# Patient Record
Sex: Male | Born: 1940 | Race: White | Hispanic: No | Marital: Married | Smoking: Former smoker
Health system: Southern US, Community
[De-identification: ages and names within clinical notes are randomized; demographics above are authoritative.]

## PROBLEM LIST (undated history)

## (undated) DIAGNOSIS — N529 Male erectile dysfunction, unspecified: Secondary | ICD-10-CM

## (undated) DIAGNOSIS — M1990 Unspecified osteoarthritis, unspecified site: Secondary | ICD-10-CM

## (undated) DIAGNOSIS — I447 Left bundle-branch block, unspecified: Secondary | ICD-10-CM

## (undated) DIAGNOSIS — Z955 Presence of coronary angioplasty implant and graft: Secondary | ICD-10-CM

## (undated) DIAGNOSIS — I2510 Atherosclerotic heart disease of native coronary artery without angina pectoris: Secondary | ICD-10-CM

## (undated) DIAGNOSIS — Z87898 Personal history of other specified conditions: Secondary | ICD-10-CM

## (undated) DIAGNOSIS — I252 Old myocardial infarction: Secondary | ICD-10-CM

## (undated) DIAGNOSIS — E7800 Pure hypercholesterolemia, unspecified: Secondary | ICD-10-CM

## (undated) DIAGNOSIS — I10 Essential (primary) hypertension: Secondary | ICD-10-CM

## (undated) DIAGNOSIS — C61 Malignant neoplasm of prostate: Secondary | ICD-10-CM

## (undated) HISTORY — PX: HEMORRHOID SURGERY: SHX153

## (undated) HISTORY — PX: CARDIAC CATHETERIZATION: SHX172

## (undated) HISTORY — PX: CARDIOVASCULAR STRESS TEST: SHX262

## (undated) HISTORY — PX: CORONARY ANGIOPLASTY WITH STENT PLACEMENT: SHX49

---

## 1978-10-04 HISTORY — PX: INGUINAL HERNIA REPAIR: SUR1180

## 2001-02-02 DIAGNOSIS — Z87898 Personal history of other specified conditions: Secondary | ICD-10-CM

## 2001-02-02 HISTORY — DX: Personal history of other specified conditions: Z87.898

## 2003-01-03 DIAGNOSIS — I252 Old myocardial infarction: Secondary | ICD-10-CM

## 2003-01-03 HISTORY — DX: Old myocardial infarction: I25.2

## 2003-01-17 ENCOUNTER — Other Ambulatory Visit: Payer: Self-pay

## 2003-01-18 ENCOUNTER — Other Ambulatory Visit: Payer: Self-pay

## 2006-04-01 ENCOUNTER — Ambulatory Visit (HOSPITAL_COMMUNITY): Admission: RE | Admit: 2006-04-01 | Discharge: 2006-04-01 | Payer: Self-pay | Admitting: Cardiovascular Disease

## 2007-08-25 ENCOUNTER — Ambulatory Visit: Payer: Self-pay | Admitting: Unknown Physician Specialty

## 2010-05-30 HISTORY — PX: TRANSTHORACIC ECHOCARDIOGRAM: SHX275

## 2012-02-24 DIAGNOSIS — C61 Malignant neoplasm of prostate: Secondary | ICD-10-CM

## 2012-02-24 HISTORY — DX: Malignant neoplasm of prostate: C61

## 2012-02-29 HISTORY — PX: PROSTATE BIOPSY: SHX241

## 2012-04-01 ENCOUNTER — Encounter: Payer: Self-pay | Admitting: Radiation Oncology

## 2012-04-05 ENCOUNTER — Encounter: Payer: Self-pay | Admitting: Radiation Oncology

## 2012-04-05 NOTE — Progress Notes (Signed)
New Consult Prostate Cancer BX:02/29/12=Adenocarcinoma,gleason 3+3=6,& 3+4=7,Volume=37.5cc,PSA=5.80 Hx :Psa 10 years ago=0.95  Married,2 sons,2 daughter,  Alert,oriented x3, no pain, no dysuria, good stream most of the time, once in a while not emptying bladder completely, bowels regular  Interested in Seed Implantation   Allergies: Codeine Derivatives  No History Radiation Therapy No History of a Pacemaker

## 2012-04-06 ENCOUNTER — Ambulatory Visit
Admission: RE | Admit: 2012-04-06 | Discharge: 2012-04-06 | Disposition: A | Discharge status: Home / Self Care | Payer: Medicare PPO | Source: Ambulatory Visit | Attending: Radiation Oncology | Admitting: Radiation Oncology

## 2012-04-06 ENCOUNTER — Encounter: Payer: Self-pay | Admitting: Radiation Oncology

## 2012-04-06 VITALS — BP 161/84 | HR 54 | Temp 98.2°F | Resp 20 | Ht 71.0 in | Wt 205.4 lb

## 2012-04-06 DIAGNOSIS — R300 Dysuria: Secondary | ICD-10-CM | POA: Insufficient documentation

## 2012-04-06 DIAGNOSIS — Z7982 Long term (current) use of aspirin: Secondary | ICD-10-CM | POA: Insufficient documentation

## 2012-04-06 DIAGNOSIS — C61 Malignant neoplasm of prostate: Secondary | ICD-10-CM | POA: Insufficient documentation

## 2012-04-06 DIAGNOSIS — R5381 Other malaise: Secondary | ICD-10-CM | POA: Insufficient documentation

## 2012-04-06 DIAGNOSIS — R3911 Hesitancy of micturition: Secondary | ICD-10-CM | POA: Insufficient documentation

## 2012-04-06 DIAGNOSIS — I252 Old myocardial infarction: Secondary | ICD-10-CM | POA: Insufficient documentation

## 2012-04-06 DIAGNOSIS — R3915 Urgency of urination: Secondary | ICD-10-CM | POA: Insufficient documentation

## 2012-04-06 DIAGNOSIS — E7800 Pure hypercholesterolemia, unspecified: Secondary | ICD-10-CM | POA: Insufficient documentation

## 2012-04-06 DIAGNOSIS — Z9861 Coronary angioplasty status: Secondary | ICD-10-CM | POA: Insufficient documentation

## 2012-04-06 DIAGNOSIS — Z79899 Other long term (current) drug therapy: Secondary | ICD-10-CM | POA: Insufficient documentation

## 2012-04-06 DIAGNOSIS — R5383 Other fatigue: Secondary | ICD-10-CM | POA: Insufficient documentation

## 2012-04-06 DIAGNOSIS — I10 Essential (primary) hypertension: Secondary | ICD-10-CM | POA: Insufficient documentation

## 2012-04-06 HISTORY — DX: Presence of coronary angioplasty implant and graft: Z95.5

## 2012-04-06 HISTORY — DX: Essential (primary) hypertension: I10

## 2012-04-06 HISTORY — DX: Pure hypercholesterolemia, unspecified: E78.00

## 2012-04-06 HISTORY — DX: Malignant neoplasm of prostate: C61

## 2012-04-06 HISTORY — DX: Unspecified osteoarthritis, unspecified site: M19.90

## 2012-04-06 HISTORY — DX: Personal history of other specified conditions: Z87.898

## 2012-04-06 HISTORY — DX: Male erectile dysfunction, unspecified: N52.9

## 2012-04-06 NOTE — Progress Notes (Signed)
Please see the Nurse Progress Note in the MD Initial Consult Encounter for this patient. 

## 2012-04-06 NOTE — Progress Notes (Signed)
  Radiation Oncology         [phone]) [phone] ________________________________  Name: Daniel Hanna MRN: [id number]  Date: 04/06/2012  DOB: 17-Mar-1940  SIMULATION AND TREATMENT PLANNING NOTE PUBIC ARCH STUDY  CC:No primary provider on file.  No ref. 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  COMPLEX SIMULATION:  The patient presented today for evaluation for possible prostate seed implant. He was brought to the radiation planning suite and placed supine on the CT couch. A 3-dimensional image study set was obtained in upload to the planning computer. There, on each axial slice, I contoured the prostate gland. Then, using three-dimensional radiation planning tools I reconstructed the prostate in view of the structures from the transperineal needle pathway to assess for possible pubic arch interference. In doing so, I did not appreciate any pubic arch interference. Also, the patient's prostate volume was estimated based on the drawn structure. The volume was 41 cc.  Given the pubic arch appearance and prostate volume, patient remains a good candidate to proceed with prostate seed implant. Today, he freely provided informed written consent to proceed.    PLAN: The patient will undergo prostate seed implant.   ________________________________  Artist Pais. Kathrynn Running, M.D.

## 2012-04-07 NOTE — Addendum Note (Signed)
Encounter addended by: Oneita Hurt, MD on: 04/07/2012  4:16 PM<BR>     Documentation filed: Notes Section

## 2012-04-07 NOTE — Progress Notes (Signed)
Radiation Oncology         [phone] ________________________________  Initial outpatient Consultation  Name: Daniel Hanna MRN: [id number]  Date: 04/06/2012  DOB: [birth year]  CC:No primary provider on file.  Marcine Matar, MD   REFERRING PHYSICIAN: Marcine Matar, MD  DIAGNOSIS: 72 y.o. gentleman with stage T1c adenocarcinoma of the prostate with a Gleason's score of 3+4 and a PSA of 5.8  HISTORY OF PRESENT ILLNESS::Daniel Hanna is a 72 y.o. gentleman.  He was noted to have an elevated PSA of 5.8 by his primary care physician, Dr. Daphine Deutscher.  Accordingly, he was referred for evaluation in urology by Dr. Retta Diones on 01/20/12,  digital rectal examination was performed at that time revealing 40 gm prostate with no nodules.  The patient proceeded to transrectal ultrasound with 12 biopsies of the prostate on 1/27/114.  The prostate volume measured 37.5 cc.  Out of 12 core biopsies,8 were positive.  The maximum Gleason score was 3+4, and this was seen in one core from the Right lateral mid and one core from the left lateral mid.  The other cores contained 3+3.  The patient reviewed the biopsy results with his urologist and he has kindly been referred today for discussion of potential radiation treatment options.  PREVIOUS RADIATION THERAPY: No  PAST MEDICAL HISTORY:  has a past medical history of History of elevated PSA (2003); Prostate cancer (02/24/12); Hypertension; Hypercholesterolemia; History of coronary artery stent placement; H/O hernia repair; H/O hemorrhoidectomy; ED (erectile dysfunction); Myocardial infarction (01/2003); Allergy; Arthritis; and Full dentures.    PAST SURGICAL HISTORY: Past Surgical History  Procedure Laterality Date  . Prostate biopsy  02/29/12    ADENOCARCINOMA    FAMILY HISTORY: family history includes Cancer (age of onset: 54) in his sister.  SOCIAL HISTORY:  reports that he has quit smoking. He has never used smokeless tobacco. He reports that he does not  drink alcohol or use illicit drugs.  ALLERGIES: Codeine  MEDICATIONS:  Current Outpatient Prescriptions  Medication Sig Dispense Refill  . aspirin 81 MG tablet Take 81 mg by mouth daily.      . carvedilol (COREG) 6.25 MG tablet Take 6.25 mg by mouth once a week.       . clopidogrel (PLAVIX) 75 MG tablet Take 75 mg by mouth daily.      . Coenzyme Q10 (CO Q 10) 100 MG CAPS Take 1 capsule by mouth as needed.      Marland Kitchen HYDROXYZINE HCL PO Take 25 mg by mouth as needed.       Marland Kitchen lisinopril (PRINIVIL,ZESTRIL) 10 MG tablet Take 10 mg by mouth daily.      . rosuvastatin (CRESTOR) 5 MG tablet Take 5 mg by mouth daily. Takes a 5mg  tab and takes this over 4 days       No current facility-administered medications for this encounter.    REVIEW OF SYSTEMS:  A 15 point review of systems is documented in the electronic medical record. This was obtained by the nursing staff. However, I reviewed this with the patient to discuss relevant findings and make appropriate changes.  A comprehensive review of systems was negative..  The patient completed an IPSS and IIEF questionnaire.  His IPSS score was 6 indicating mild urinary outflow obstructive symptoms.  He indicated that his erectile function is not an issue.   PHYSICAL EXAM: This patient is in no acute distress.  He is alert and oriented.   height is 5\' 11"  (1.803 m) and weight is 205  lb 6.4 oz (93.169 kg). His oral temperature is 98.2 F (36.8 C). His blood pressure is 161/84 and his pulse is 54. His respiration is 20.  He exhibits no respiratory distress or labored breathing.  He appears neurologically intact.  His mood is pleasant.  His affect is appropriate.  Please note the digital rectal exam findings described above.  LABORATORY DATA:  No results found for this basename: WBC, HGB, HCT, MCV, PLT   No results found for this basename: NA, K, CL, CO2   No results found for this basename: ALT, AST, GGT, ALKPHOS, BILITOT     RADIOGRAPHY: No results  found.    IMPRESSION: This gentleman is a 72 y.o. gentleman with stage T1c adenocarcinoma of the prostate with a Gleason's score of 3+4 and a PSA of 5.8.  His T-Stage, Gleason's Score, and PSA put him into the intermediate risk group. The small volume of Gleason's 7 with a primary grade of 3 places him into a select subset of patients eligible for brachytherapy as monotherapy.    PLAN:Today I reviewed the findings and workup thus far.  We discussed the natural history of prostate cancer.  We reviewed the the implications of T-stage, Gleason's Score, and PSA on decision-making and outcomes in prostate cancer.  We discussed radiation treatment in the management of prostate cancer with regard to the logistics and delivery of external beam radiation treatment as well as the logistics and delivery of prostate brachytherapy.  We compared and contrasted each of these approaches and also compared these against prostatectomy.  The patient expressed interest in prostate brachytherapy.  I filled out a patient counseling form for him with relevant treatment diagrams and we retained a copy for our records.   The patient would like to proceed with prostate brachytherapy.  I will share my findings with Dr. Retta Diones and move forward with scheduling the procedure in the near future.     I enjoyed meeting with him today, and will look forward to participating in the care of this very nice gentleman.  I spent 60 minutes face to face with the patient and more than 50% of that time was spent in counseling and/or coordination of care.   ------------------------------------------------  Artist Pais. Kathrynn Running, M.D.

## 2012-04-12 ENCOUNTER — Other Ambulatory Visit: Payer: Self-pay | Admitting: Urology

## 2012-04-12 ENCOUNTER — Telehealth: Payer: Self-pay | Admitting: *Deleted

## 2012-04-12 NOTE — Telephone Encounter (Signed)
CALLED PATIENT TO INFORM OF IMPLANT DATE, LVM FOR A RETURN CALL 

## 2012-04-13 ENCOUNTER — Other Ambulatory Visit (HOSPITAL_BASED_OUTPATIENT_CLINIC_OR_DEPARTMENT_OTHER): Payer: Self-pay | Admitting: Urology

## 2012-04-13 ENCOUNTER — Ambulatory Visit (HOSPITAL_COMMUNITY)
Admission: RE | Admit: 2012-04-13 | Discharge: 2012-04-13 | Disposition: A | Discharge status: Home / Self Care | Payer: Medicare PPO | Source: Ambulatory Visit | Attending: Urology | Admitting: Urology

## 2012-04-13 DIAGNOSIS — C61 Malignant neoplasm of prostate: Secondary | ICD-10-CM | POA: Insufficient documentation

## 2012-04-13 DIAGNOSIS — J984 Other disorders of lung: Secondary | ICD-10-CM | POA: Insufficient documentation

## 2012-04-13 DIAGNOSIS — R911 Solitary pulmonary nodule: Secondary | ICD-10-CM | POA: Insufficient documentation

## 2012-04-13 DIAGNOSIS — Z01811 Encounter for preprocedural respiratory examination: Secondary | ICD-10-CM

## 2012-04-13 DIAGNOSIS — Z01818 Encounter for other preprocedural examination: Secondary | ICD-10-CM | POA: Insufficient documentation

## 2012-04-13 DIAGNOSIS — K449 Diaphragmatic hernia without obstruction or gangrene: Secondary | ICD-10-CM | POA: Insufficient documentation

## 2012-05-04 ENCOUNTER — Telehealth: Payer: Self-pay | Admitting: *Deleted

## 2012-05-04 NOTE — Telephone Encounter (Signed)
CALLED PATIENT TO REMIND OF APPT., SPOKE WITH PT. AND HE IS AWARE OF THIS APPT.

## 2012-05-05 ENCOUNTER — Encounter (HOSPITAL_BASED_OUTPATIENT_CLINIC_OR_DEPARTMENT_OTHER): Payer: Self-pay | Admitting: *Deleted

## 2012-05-05 LAB — COMPREHENSIVE METABOLIC PANEL
AST: 18 U/L (ref 0–37)
BUN: 12 mg/dL (ref 6–23)
CO2: 28 mEq/L (ref 19–32)
Calcium: 9.4 mg/dL (ref 8.4–10.5)
Chloride: 104 mEq/L (ref 96–112)
Creatinine, Ser: 0.86 mg/dL (ref 0.50–1.35)
GFR calc Af Amer: >90 mL/min (ref >90)
GFR calc non Af Amer: 84 mL/min — ABNORMAL LOW (ref >90)
Glucose, Bld: 118 mg/dL — ABNORMAL HIGH (ref 70–99)
Total Bilirubin: 0.6 mg/dL (ref 0.3–1.2)

## 2012-05-05 LAB — CBC
HCT: 41.5 % (ref 39.0–52.0)
MCH: 31.8 pg (ref 26.0–34.0)
MCV: 90.4 fL (ref 78.0–100.0)
RBC: 4.59 MIL/uL (ref 4.22–5.81)
WBC: 5.2 10*3/uL (ref 4.0–10.5)

## 2012-05-05 LAB — PROTIME-INR
INR: 1.05 (ref 0.00–1.49)
Prothrombin Time: 13.6 seconds (ref 11.6–15.2)

## 2012-05-06 ENCOUNTER — Encounter (HOSPITAL_BASED_OUTPATIENT_CLINIC_OR_DEPARTMENT_OTHER): Payer: Self-pay | Admitting: *Deleted

## 2012-05-06 NOTE — Progress Notes (Signed)
NPO AFTER MN. ARRIVES AT 0815. WILL TAKE COREG AM OF SURG W/ SIP OF WATER AND DO FLEET ENEMA . CURRENT LAB WORK, EKG, AND CXR IN EPIC AND CHART.  ALSO, LOV NOTE, STRESS TEST AND ECHO FROM DR BERRY W/ CHART.

## 2012-05-11 ENCOUNTER — Telehealth: Payer: Self-pay | Admitting: *Deleted

## 2012-05-11 NOTE — H&P (Signed)
Urology History and Physical Exam  CC: Prostate cancer  HPI: 72 year old male presents for brachytherapy. His history is as follows:  He presented with a PSA of 5.8. 10 years ago, his PSA was 0.95. His prostate exam was normal palpably. Ultrasound and biopsy of the prostate was performed on 02/29/2012. Ultrasound volume of the prostate was 37.5 cc. 8/12 cores returned adenocarcinoma, as follows:  Right base lateral, Gleason 3+3, 5% of core Right base medial, Gleason 3+3, 5% of core Right mid lateral, Gleason 3+4, 30% of core Right apex revealed Gleason 3+3, 60% of core Left base lateral, Gleason 3+3, 5% of core Left mid medial, Gleason 3+3, 10% of core Left mid lateral, Gleason 3+4, 5% of core Left apex medial, Gleason 3+3, 80% of core  He consulted wit Dr. Kathrynn Running at the [facility] and presents for brachytherapy.   PMH: Past Medical History  Diagnosis Date  . History of elevated PSA 2003    PSA=0.95  . Prostate cancer 02/24/12    BX=Adenocarcinoma,Gleason 3+3=6,3+4=7 PSA=5.80  . Hypertension   . Hypercholesterolemia   . History of coronary artery stent placement     STENTS X2  TO LAD AND CIRCUMFLEX  . ED (erectile dysfunction)   . Arthritis     fingers  . History of myocardial infarction DEC 2004    S/P STENT X2  AT [facility]  . LBBB (left bundle branch block)   . Coronary artery disease CARDIOLOGIST--   DR BERRY--  LOV NOTE 01-29-2012  W/ CHART    PSH: Past Surgical History  Procedure Laterality Date  . Prostate biopsy  02/29/12    ADENOCARCINOMA  . Hemorrhoid surgery    . Cardiovascular stress test  05-30-2010   DR BERRY    LOW RISK SCAN/  MODERATE SIZED INEROLATERAL SCAR WITHOUT ISCHEMIA / ISCHEMIA/ NO SIGNIFICANT CHANGE FROM PREVIOUS STUDY/   . Transthoracic echocardiogram  05-30-2010    MILD CONCENTRIC LVH/ EF 55%/ STAGE I DIASTOLIC DYSFUCTION/ MILD MR, TR, & AR/ MILD SCLEROTIC AORTIC VALVE WITHOUT STENOSIS  . Cardiac catheterization  03-02-2006  DR BERRY    PATENT  STENTS/ 30-40% TAPERED DISTAL LEFT MAIN AND 50-60% AV GROOVE CIRCUMFLEX PRIOR TO STENT  &  50% LESION IN MID PORTION OF LAD.  Marland Kitchen Coronary angioplasty with stent placement  DEC 2004  AT [facility]    STENTING OF CIRCUMFLEX AND LAD  . Inguinal hernia repair  1980'S    Allergies: Allergies  Allergen Reactions  . Codeine Other (See Comments)    Deriviatives, dysfunctional ,woozy,spinning,dizzy    Medications: No prescriptions prior to admission     Social History: History   Social History  . Marital Status: Married    Spouse Name: N/A    Number of Children: N/A  . Years of Education: N/A   Occupational History  . Not on file.   Social History Main Topics  . Smoking status: Former Smoker -- 1.00 packs/day for 25 years    Quit date: 05/06/2001  . Smokeless tobacco: Never Used  . Alcohol Use: No  . Drug Use: No  . Sexually Active: Not on file   Other Topics Concern  . Not on file   Social History Narrative  . No narrative on file    Family History: Family History  Problem Relation Age of Onset  . Cancer Sister 50    unknown    Review of Systems: Positive:  Negative:   A further 10 point review of systems was negative except what  is listed in the HPI.  Physical Exam: @VITALS2 @ General: No acute distress.  Awake. Head:  Normocephalic.  Atraumatic. ENT:  EOMI.  Mucous membranes moist Neck:  Supple.  No lymphadenopathy. CV:  S1 present. S2 present. Regular rate. Pulmonary: Equal effort bilaterally.  Clear to auscultation bilaterally. Abdomen: Soft.  Non tender to palpation. Skin:  Normal turgor.  No visible rash. Extremity: No gross deformity of bilateral upper extremities.  No gross deformity of    bilateral lower extremities. Neurologic: Alert. Appropriate mood.    Studies:  No results found for this basename: HGB, WBC, PLT,  in the last 72 hours  No results found for this basename: NA, K, CL, CO2, BUN, CREATININE, CALCIUM, MAGNESIUM, GFRNONAA, GFRAA,  in  the last 72 hours   No results found for this basename: PT, INR, APTT,  in the last 72 hours   No components found with this basename: ABG,     Assessment:  Adenocarcinoma of the prostate  Plan: I-125 brachytherapy

## 2012-05-11 NOTE — Telephone Encounter (Signed)
Called patient to inform of implant for 05-12-12, spoke with patient and he is aware of this procedure.

## 2012-05-12 ENCOUNTER — Encounter (HOSPITAL_BASED_OUTPATIENT_CLINIC_OR_DEPARTMENT_OTHER): Payer: Self-pay | Admitting: Anesthesiology

## 2012-05-12 ENCOUNTER — Ambulatory Visit (HOSPITAL_COMMUNITY): Payer: Medicare PPO

## 2012-05-12 ENCOUNTER — Ambulatory Visit (HOSPITAL_BASED_OUTPATIENT_CLINIC_OR_DEPARTMENT_OTHER): Payer: Medicare PPO | Admitting: Anesthesiology

## 2012-05-12 ENCOUNTER — Ambulatory Visit (HOSPITAL_BASED_OUTPATIENT_CLINIC_OR_DEPARTMENT_OTHER)
Admission: RE | Admit: 2012-05-12 | Discharge: 2012-05-12 | Disposition: A | Discharge status: Home / Self Care | Payer: Medicare PPO | Source: Ambulatory Visit | Attending: Urology | Admitting: Urology

## 2012-05-12 ENCOUNTER — Encounter (HOSPITAL_BASED_OUTPATIENT_CLINIC_OR_DEPARTMENT_OTHER)
Admission: RE | Disposition: A | Discharge status: Home / Self Care | Payer: Self-pay | Source: Ambulatory Visit | Attending: Urology

## 2012-05-12 DIAGNOSIS — E7800 Pure hypercholesterolemia, unspecified: Secondary | ICD-10-CM | POA: Insufficient documentation

## 2012-05-12 DIAGNOSIS — I252 Old myocardial infarction: Secondary | ICD-10-CM | POA: Insufficient documentation

## 2012-05-12 DIAGNOSIS — C61 Malignant neoplasm of prostate: Secondary | ICD-10-CM | POA: Insufficient documentation

## 2012-05-12 DIAGNOSIS — I10 Essential (primary) hypertension: Secondary | ICD-10-CM | POA: Insufficient documentation

## 2012-05-12 DIAGNOSIS — I2510 Atherosclerotic heart disease of native coronary artery without angina pectoris: Secondary | ICD-10-CM | POA: Insufficient documentation

## 2012-05-12 HISTORY — DX: Left bundle-branch block, unspecified: I44.7

## 2012-05-12 HISTORY — PX: RADIOACTIVE SEED IMPLANT: SHX5150

## 2012-05-12 HISTORY — DX: Old myocardial infarction: I25.2

## 2012-05-12 HISTORY — DX: Atherosclerotic heart disease of native coronary artery without angina pectoris: I25.10

## 2012-05-12 SURGERY — INSERTION, RADIATION SOURCE, PROSTATE
Anesthesia: General | Site: Prostate | Wound class: Clean Contaminated

## 2012-05-12 MED ORDER — MEPERIDINE HCL 25 MG/ML IJ SOLN
6.2500 mg | INTRAMUSCULAR | Status: DC | PRN
  Filled 2012-05-12: qty 1

## 2012-05-12 MED ORDER — ONDANSETRON HCL 4 MG/2ML IJ SOLN
INTRAMUSCULAR | Status: DC | PRN
  Administered 2012-05-12: 4 mg via INTRAVENOUS

## 2012-05-12 MED ORDER — LIDOCAINE HCL (CARDIAC) 20 MG/ML IV SOLN
INTRAVENOUS | Status: DC | PRN
  Administered 2012-05-12: 100 mg via INTRAVENOUS

## 2012-05-12 MED ORDER — ACETAMINOPHEN 10 MG/ML IV SOLN
1000.0000 mg | Freq: Once | INTRAVENOUS | Status: DC | PRN
  Filled 2012-05-12: qty 100

## 2012-05-12 MED ORDER — DEXAMETHASONE SODIUM PHOSPHATE 4 MG/ML IJ SOLN
INTRAMUSCULAR | Status: DC | PRN
  Administered 2012-05-12: 10 mg via INTRAVENOUS

## 2012-05-12 MED ORDER — LACTATED RINGERS IV SOLN
INTRAVENOUS | Status: DC
  Administered 2012-05-12 (×2): via INTRAVENOUS
  Filled 2012-05-12: qty 1000

## 2012-05-12 MED ORDER — FENTANYL CITRATE 0.05 MG/ML IJ SOLN
INTRAMUSCULAR | Status: DC | PRN
  Administered 2012-05-12 (×6): 50 ug via INTRAVENOUS

## 2012-05-12 MED ORDER — PROMETHAZINE HCL 25 MG/ML IJ SOLN
6.2500 mg | INTRAMUSCULAR | Status: DC | PRN
  Filled 2012-05-12: qty 1

## 2012-05-12 MED ORDER — PROPOFOL 10 MG/ML IV BOLUS
INTRAVENOUS | Status: DC | PRN
  Administered 2012-05-12: 200 mg via INTRAVENOUS

## 2012-05-12 MED ORDER — IOHEXOL 350 MG/ML SOLN
INTRAVENOUS | Status: DC | PRN
  Administered 2012-05-12: 7 mL

## 2012-05-12 MED ORDER — CIPROFLOXACIN HCL 250 MG PO TABS: 250.0000 mg | ORAL_TABLET | Freq: Two times a day (BID) | ORAL | Status: DC

## 2012-05-12 MED ORDER — FLEET ENEMA 7-19 GM/118ML RE ENEM
1.0000 | ENEMA | Freq: Once | RECTAL | Status: DC
  Filled 2012-05-12: qty 1

## 2012-05-12 MED ORDER — CIPROFLOXACIN IN D5W 400 MG/200ML IV SOLN
400.0000 mg | INTRAVENOUS | Status: AC
  Administered 2012-05-12: 400 mg via INTRAVENOUS
  Filled 2012-05-12: qty 200

## 2012-05-12 MED ORDER — EPHEDRINE SULFATE 50 MG/ML IJ SOLN
INTRAMUSCULAR | Status: DC | PRN
  Administered 2012-05-12: 10 mg via INTRAVENOUS

## 2012-05-12 MED ORDER — TRAMADOL HCL 50 MG PO TABS: 50.0000 mg | ORAL_TABLET | Freq: Four times a day (QID) | ORAL | Status: DC | PRN

## 2012-05-12 MED ORDER — HYDROMORPHONE HCL PF 1 MG/ML IJ SOLN
0.2500 mg | INTRAMUSCULAR | Status: DC | PRN
  Filled 2012-05-12: qty 1

## 2012-05-12 MED ORDER — STERILE WATER FOR IRRIGATION IR SOLN
Status: DC | PRN
  Administered 2012-05-12: 3000 mL via INTRAVESICAL

## 2012-05-12 SURGICAL SUPPLY — 24 items
BAG URINE DRAINAGE (UROLOGICAL SUPPLIES) ×2 IMPLANT
BLADE SURG ROTATE 9660 (MISCELLANEOUS) ×2 IMPLANT
CATH FOLEY 2WAY SLVR  5CC 16FR (CATHETERS) ×1
CATH FOLEY 2WAY SLVR 5CC 16FR (CATHETERS) ×1 IMPLANT
CATH ROBINSON RED A/P 20FR (CATHETERS) ×2 IMPLANT
CLOTH BEACON ORANGE TIMEOUT ST (SAFETY) ×2 IMPLANT
COVER MAYO STAND STRL (DRAPES) ×2 IMPLANT
COVER TABLE BACK 60X90 (DRAPES) ×2 IMPLANT
DRSG TEGADERM 4X4.75 (GAUZE/BANDAGES/DRESSINGS) ×2 IMPLANT
DRSG TEGADERM 8X12 (GAUZE/BANDAGES/DRESSINGS) ×4 IMPLANT
GAUZE SPONGE 4X4 12PLY STRL LF (GAUZE/BANDAGES/DRESSINGS) ×2 IMPLANT
GLOVE BIO SURGEON STRL SZ7.5 (GLOVE) ×2 IMPLANT
GLOVE BIO SURGEON STRL SZ8 (GLOVE) ×10 IMPLANT
GLOVE ECLIPSE 8.0 STRL XLNG CF (GLOVE) IMPLANT
GLOVE INDICATOR 7.5 STRL GRN (GLOVE) ×2 IMPLANT
GOWN PREVENTION PLUS LG XLONG (DISPOSABLE) ×2 IMPLANT
GOWN STRL REIN XL XLG (GOWN DISPOSABLE) ×2 IMPLANT
HOLDER FOLEY CATH W/STRAP (MISCELLANEOUS) ×2 IMPLANT
PACK CYSTOSCOPY (CUSTOM PROCEDURE TRAY) ×2 IMPLANT
SYRINGE 10CC LL (SYRINGE) ×2 IMPLANT
Selectseed I-125 ×2 IMPLANT
UNDERPAD 30X30 INCONTINENT (UNDERPADS AND DIAPERS) ×4 IMPLANT
WATER STERILE IRR 3000ML UROMA (IV SOLUTION) ×2 IMPLANT
WATER STERILE IRR 500ML POUR (IV SOLUTION) ×2 IMPLANT

## 2012-05-12 NOTE — Anesthesia Procedure Notes (Signed)
Procedure Name: LMA Insertion Date/Time: 05/12/2012 9:41 AM Performed by: Maris Berger T Pre-anesthesia Checklist: Patient identified, Emergency Drugs available, Suction available and Patient being monitored Patient Re-evaluated:Patient Re-evaluated prior to inductionOxygen Delivery Method: Circle System Utilized Preoxygenation: Pre-oxygenation with 100% oxygen Intubation Type: IV induction Ventilation: Mask ventilation without difficulty LMA: LMA inserted LMA Size: 5.0 Number of attempts: 1 Placement Confirmation: positive ETCO2 Tube secured with: Tape Dental Injury: Teeth and Oropharynx as per pre-operative assessment

## 2012-05-12 NOTE — Op Note (Signed)
Preoperative diagnosis: Clinical stage TI C adenocarcinoma the prostate   Postoperative diagnosis: Same   Procedure: I-125 prostate seed implantation with Nucletron robotic implanter   Surgeon: Bertram Millard. Lisamarie Coke M.D.  Radiation Oncologist:Matthew Kathrynn Running M.D.  Anesthesia: Gen.   Indications: Patient  was diagnosed with clinical stage TI C prostate cancer. We had extensive discussion with him about treatment options versus. He elected to proceed with seed implantation. He underwent consultation my office as well as with Dr. Kathrynn Running. He appeared to understand the advantages disadvantages potential risks of this treatment option. Full informed consent has been obtained.   Technique and findings: Patient was brought the operating room where he had successful induction of general anesthesia. He was placed in dorso-lithotomy position and prepped and draped in usual manner. Appropriate surgical timeout was performed. Radiation oncology department placed a transrectal ultrasound probe anchoring stand. Foley catheter with contrast in the balloon was inserted without difficulty. Anchoring needles were placed within the prostate. Rectal tube was placed. Real-time contouring of the urethra prostate and rectum were performed and the dosing parameters were established. Targeted dose was 145 gray.  I was then called  to the operating suite suite for placement of the needles. A second timeout was performed. All needle passage was done with real-time transrectal ultrasound guidance with the sagittal plane. A total of 21 activated needles were placed. The implantation itself was done with the robotic implanter. 62 active seeds were implanted. A Foley catheter was removed and flexible cystoscopy failed to show any seeds outside the prostate.  The patient was brought to recovery room in stable condition.

## 2012-05-12 NOTE — Anesthesia Preprocedure Evaluation (Addendum)
Anesthesia Evaluation  Patient identified by MRN, date of birth, ID band Patient awake    Reviewed: Allergy & Precautions, H&P , NPO status , Patient's Chart, lab work & pertinent test results, reviewed documented beta blocker date and time   Airway Mallampati: II TM Distance: >3 FB Neck ROM: Full    Dental  (+) Dental Advisory Given, Edentulous Upper and Edentulous Lower   Pulmonary neg pulmonary ROS,  breath sounds clear to auscultation        Cardiovascular hypertension, Pt. on medications and Pt. on home beta blockers + CAD Rhythm:Regular Rate:Normal     Neuro/Psych PSYCHIATRIC DISORDERS negative neurological ROS     GI/Hepatic negative GI ROS, Neg liver ROS,   Endo/Other  negative endocrine ROS  Renal/GU negative Renal ROS     Musculoskeletal negative musculoskeletal ROS (+)   Abdominal   Peds  Hematology negative hematology ROS (+)   Anesthesia Other Findings   Reproductive/Obstetrics                         Anesthesia Physical Anesthesia Plan  ASA: III  Anesthesia Plan: General   Post-op Pain Management:    Induction: Intravenous  Airway Management Planned: Oral ETT and LMA  Additional Equipment:   Intra-op Plan:   Post-operative Plan: Extubation in OR  Informed Consent: I have reviewed the patients History and Physical, chart, labs and discussed the procedure including the risks, benefits and alternatives for the proposed anesthesia with the patient or authorized representative who has indicated his/her understanding and acceptance.   Dental advisory given  Plan Discussed with: CRNA  Anesthesia Plan Comments:         Anesthesia Quick Evaluation

## 2012-05-12 NOTE — Transfer of Care (Signed)
Immediate Anesthesia Transfer of Care Note  Patient: Daniel Hanna  Procedure(s) Performed: Procedure(s) with comments: RADIOACTIVE SEED IMPLANT (N/A) - DR Mikal Plane [facility] NEEDED  Patient Location: PACU  Anesthesia Type:General  Level of Consciousness: sedated and responds to stimulation  Airway & Oxygen Therapy: Patient Spontanous Breathing and Patient connected to face mask oxygen  Post-op Assessment: Report given to PACU RN  Post vital signs: Reviewed and stable  Complications: No apparent anesthesia complications

## 2012-05-12 NOTE — Anesthesia Postprocedure Evaluation (Signed)
Anesthesia Post Note  Patient: Daniel Hanna  Procedure(s) Performed: Procedure(s) (LRB): RADIOACTIVE SEED IMPLANT (N/A)  Anesthesia type: General  Patient location: PACU  Post pain: Pain level controlled  Post assessment: Post-op Vital signs reviewed  Last Vitals: BP 137/57  Pulse 60  Temp(Src) 36.3 C (Oral)  Resp 8  Ht 5\' 11"  (1.803 m)  Wt 204 lb 5 oz (92.676 kg)  BMI 28.51 kg/m2  SpO2 98%  Post vital signs: Reviewed  Level of consciousness: sedated  Complications: No apparent anesthesia complications

## 2012-05-13 ENCOUNTER — Encounter (HOSPITAL_BASED_OUTPATIENT_CLINIC_OR_DEPARTMENT_OTHER): Payer: Self-pay | Admitting: Urology

## 2012-05-15 NOTE — Op Note (Signed)
  Radiation Oncology         [phone] ________________________________  Name: Daniel Hanna MRN: [id number]  Date: 04/11/2012  DOB: 06-11-1940       Prostate Seed Implant  CC:No primary provider on file.  No ref. provider found  DIAGNOSIS: 72 y.o. gentleman with stage T1c adenocarcinoma of the prostate with a Gleason's score of 3+4 and a PSA of 5.8  PROCEDURE: Insertion of radioactive I-125 seeds into the prostate gland.  RADIATION DOSE: 145 Gy, definitive therapy.  TECHNIQUE: Daniel Hanna was brought to the operating room with the urologist. He was placed in the dorsolithotomy position. He was catheterized and a rectal tube was inserted. The perineum was shaved, prepped and draped. The ultrasound probe was then introduced into the rectum to see the prostate gland.  TREATMENT DEVICE: A needle grid was attached to the ultrasound probe stand and anchor needles were placed.  COMPLEX ISODOSE CALCULATION: The prostate was imaged in 3D using a sagittal sweep of the prostate probe. These images were transferred to the planning computer. There, the prostate, urethra and rectum were defined on each axial reconstructed image. Then, the software created an optimized plan and a few seed positions were adjusted. Then the accepted plan was uploaded to the seed Selectron afterloading unit.  SPECIAL TREATMENT PROCEDURE/SUPERVISION AND HANDLING: The Nucletron FIRST system was used to place the needles under sagittal guidance. A total of 21 needles were used to deposit 62 seeds in the prostate gland. The individual seed activity was 0.461 mCi for a total implant activity of 25.58 mCi.  COMPLEX SIMULATION: At the end of the procedure, an anterior radiograph of the pelvis was obtained to document seed positioning and count. Cystoscopy was performed to check the urethra and bladder.  MICRODOSIMETRY: At the end of the procedure, the patient was emitting 0.045 mrem/hr at 1 meter. Accordingly, he was considered  safe for hospital discharge.  PLAN: The patient will return to the radiation oncology clinic for post implant CT dosimetry in three weeks.   ________________________________  Artist Pais Kathrynn Running, M.D.

## 2012-06-01 ENCOUNTER — Telehealth: Payer: Self-pay | Admitting: *Deleted

## 2012-06-01 NOTE — Telephone Encounter (Signed)
CALLED PATIENT TO REMIND OF APPTS. FOR 06-02-12, SPOKE WITH PATIENT AND CONFIRMED APPTS. WITH THIS PATIENT.

## 2012-06-02 ENCOUNTER — Encounter: Payer: Self-pay | Admitting: Radiation Oncology

## 2012-06-02 ENCOUNTER — Ambulatory Visit
Admission: RE | Admit: 2012-06-02 | Discharge: 2012-06-02 | Disposition: A | Discharge status: Home / Self Care | Payer: Medicare PPO | Source: Ambulatory Visit | Attending: Radiation Oncology | Admitting: Radiation Oncology

## 2012-06-02 VITALS — BP 120/61 | HR 58 | Temp 98.2°F | Resp 20 | Wt 204.0 lb

## 2012-06-02 DIAGNOSIS — C61 Malignant neoplasm of prostate: Secondary | ICD-10-CM

## 2012-06-02 NOTE — Progress Notes (Signed)
  Radiation Oncology         [phone] ________________________________  Name: Daniel Hanna MRN: [id number]  Date: 06/02/2012  DOB: Jun 21, 1940  COMPLEX SIMULATION NOTE  NARRATIVE:  The patient was brought to the CT Simulation planning suite today following prostate seed implantation approximately one month ago.  Identity was confirmed.  All relevant records and images related to the planned course of therapy were reviewed.  Then, the patient was set-up supine.  CT images were obtained.  The CT images were loaded into the planning software.  Then the prostate and rectum were contoured.  Treatment planning then occurred.  The implanted iodine 125 seeds were identified by the physics staff for projection of radiation distribution  I have requested : 3D Simulation  I have requested a DVH of the following structures: Prostate and rectum.    ________________________________  Artist Pais Kathrynn Running, M.D.

## 2012-06-02 NOTE — Progress Notes (Addendum)
Patient her s/p prostate seed implant,  04/11/12 62 seeds placed by Dr.MASnning, and Dr. Retta Diones Alert6,oriented x3, ambulatory steady gait, no pain, but wjhen first getting up ion am, has urgency, sits down to void and has trouble starting, and comes out"Tinkling' slight dysuiria, then rest of the day fine stated  Patient, bowels, regylar, nocturia 2-3x , appetite good, no hematuria, fatigue some better,, I-PSS today score=13 10:35 AM

## 2012-06-02 NOTE — Progress Notes (Signed)
Radiation Oncology         [phone] ________________________________  Name: Daniel Hanna MRN: [id number]  Date: 06/02/2012  DOB: 02-Sep-1940  Follow-Up Visit Note  CC: No primary provider on file.  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  Interval Since Last Radiation:  4  weeks  Narrative:  The patient returns today for routine follow-up.  He is complaining of increased urinary frequency and urinary hesitation symptoms. He filled out a questionnaire regarding urinary function today providing and overall IPSS score of 15 characterizing his symptoms as moderate.  His pre-implant score was 5. He denies any bowel symptoms.  ALLERGIES:  is allergic to codeine.  Meds: Current Outpatient Prescriptions  Medication Sig Dispense Refill  . aspirin 81 MG tablet Take 81 mg by mouth daily.      . carvedilol (COREG) 6.25 MG tablet Take 6.25 mg by mouth 2 (two) times daily.       . Cholecalciferol (VITAMIN D3) 1000 UNITS CAPS Take 1,000 Units by mouth daily.      . clopidogrel (PLAVIX) 75 MG tablet Take 75 mg by mouth daily.      . Coenzyme Q10 (CO Q 10) 100 MG CAPS Take 1 capsule by mouth as needed (FOR SHOULDER PAIN).       Marland Kitchen HYDROXYZINE HCL PO Take 25 mg by mouth as needed.       Marland Kitchen lisinopril (PRINIVIL,ZESTRIL) 10 MG tablet Take 10 mg by mouth daily.      . rosuvastatin (CRESTOR) 5 MG tablet Take 5 mg by mouth every evening.       . traMADol (ULTRAM) 50 MG tablet Take 1 tablet (50 mg total) by mouth every 6 (six) hours as needed for pain.  30 tablet     No current facility-administered medications for this encounter.    Physical Findings: The patient is in no acute distress. Patient is alert and oriented.  weight is 204 lb (92.534 kg). His oral temperature is 98.2 F (36.8 C). His blood pressure is 120/61 and his pulse is 58. His respiration is 20. Marland Kitchen  No significant changes.  Lab  Findings: Lab Results  Component Value Date   WBC 5.2 05/05/2012   HGB 14.6 05/05/2012   HCT 41.5 05/05/2012   MCV 90.4 05/05/2012   PLT 155 05/05/2012    Radiographic Findings:  Patient underwent CT imaging in our clinic for post implant dosimetry. The CT appears to demonstrate an adequate distribution of radioactive seeds throughout the prostate gland. There no seeds in her near the rectum. I suspect the final radiation plan and dosimetry will show appropriate coverage of the prostate gland.   Impression: The patient is recovering from the effects of radiation. His urinary symptoms should gradually improve over the next 4-6 months. We talked about this today. He is encouraged by his improvement already and is otherwise please with his outcome.   Plan: Today, I spent time talking to the patient about his prostate seed implant and resolving urinary symptoms. We also talked about long-term follow-up for prostate cancer following seed implant. He understands that ongoing PSA determinations and digital rectal exams will help perform surveillance to rule out disease recurrence. He understands what to expect with his PSA measures. Patient was also educated today about some of the long-term effects would radiation including the Small risk for rectal bleeding and possibly erectile  dysfunction. Talked about some of the general management approaches to these potential complications. However, I did encourage the patient to contact her office or return at any point if he has questions or concerns related to his previous radiation and prostate cancer.   _____________________________________  Artist Pais. Kathrynn Running, M.D.

## 2012-07-04 ENCOUNTER — Encounter: Payer: Self-pay | Admitting: Radiation Oncology

## 2012-07-22 NOTE — Progress Notes (Signed)
  Radiation Oncology         [phone] ________________________________  Name: Daniel Hanna MRN: [id number]  Date: 07/04/2012  DOB: May 24, 1940  3-D Planning Note Prostate Brachytherapy  Diagnosis: 72 y.o. gentleman with stage T1c adenocarcinoma of the prostate with a Gleason's score of 3+4 and a PSA of 5.8  Narrative: On a previous date, Daniel Hanna returned following prostate seed implantation for post implant planning. He underwent CT scan complex simulation to delineate the three-dimensional structures of the pelvis and demonstrate the radiation distribution.  Since that time, the seed localization, and 3D planning with dose volume histograms have now been completed.  Results:   Prostate Coverage - The dose of radiation delivered to the 90% or more of the prostate gland (D90) was 110.09% of the prescription dose. This exceeds our goal of greater than 90%. Rectal Sparing - The volume of rectal tissue receiving the prescription dose or higher was 0.35 cc. This falls under our thresholds tolerance of 1.0 cc.  Impression: The prostate seed implant appears to show adequate target coverage and appropriate rectal sparing.  Plan:  The patient will continue to follow with urology for ongoing PSA determinations. I would anticipate a high likelihood for local tumor control with minimal risk for rectal morbidity.  ________________________________  Artist Pais Kathrynn Running, M.D.

## 2013-01-11 ENCOUNTER — Ambulatory Visit (INDEPENDENT_AMBULATORY_CARE_PROVIDER_SITE_OTHER): Payer: Medicare PPO | Admitting: Cardiovascular Disease

## 2013-01-11 ENCOUNTER — Encounter: Payer: Self-pay | Admitting: Cardiovascular Disease

## 2013-01-11 VITALS — BP 122/64 | HR 69 | Ht 71.0 in | Wt 209.0 lb

## 2013-01-11 DIAGNOSIS — I447 Left bundle-branch block, unspecified: Secondary | ICD-10-CM

## 2013-01-11 DIAGNOSIS — I10 Essential (primary) hypertension: Secondary | ICD-10-CM

## 2013-01-11 DIAGNOSIS — I2510 Atherosclerotic heart disease of native coronary artery without angina pectoris: Secondary | ICD-10-CM

## 2013-01-11 DIAGNOSIS — R0989 Other specified symptoms and signs involving the circulatory and respiratory systems: Secondary | ICD-10-CM

## 2013-01-11 DIAGNOSIS — E785 Hyperlipidemia, unspecified: Secondary | ICD-10-CM | POA: Insufficient documentation

## 2013-01-11 NOTE — Assessment & Plan Note (Signed)
Controlled on current medications 

## 2013-01-11 NOTE — Progress Notes (Signed)
01/11/2013 Daniel Hanna   Dec 28, 1940  [id number]  Primary Physician No primary provider on file. Primary Cardiologist: Daniel Gess MD Roseanne Reno   HPI:  The patient is a very pleasant 72 year old mildly overweight married Caucasian male, father of 4 and grandfather to 6 grandchildren, whom I last saw on April 10, 2010. He had a history of CAD, status post stenting of his circumflex and LAD by Dr. Orlena Sheldon at [facility] in 2004. His other problems include hypertension and hyperlipidemia. I catheterized him on March 02, 2006, revealing patent stents to his proximal LAD and mid circumflex, 30% to 40% tapered distal left main and 50% to 60% AV groove circumflex prior to the stent as well as a 50% lesion in the LAD in the mid portion. 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 Since I last saw him he really denies chest pain or shortness of breath. Dr. Hyacinth Meeker follows his lipid profile. Apparently he recently had an increase in his PSA and saw a urologist over at [facility] and he recommended a prostate biopsy.  Since I saw him one year ago he has been asymptomatic. Her recent lipid profile performed by Dr. Hyacinth Meeker revealed a total cholesterol 1:30, LDL of 71 and HDL of 44. He is contemplating having a total knee replacement.     Current Outpatient Prescriptions  Medication Sig Dispense Refill  . aspirin 81 MG tablet Take 81 mg by mouth daily.      . carvedilol (COREG) 6.25 MG tablet Take 6.25 mg by mouth 2 (two) times daily.       . clopidogrel (PLAVIX) 75 MG tablet Take 75 mg by mouth daily.      Marland Kitchen HYDROXYZINE HCL PO Take 25 mg by mouth as needed.       Marland Kitchen lisinopril (PRINIVIL,ZESTRIL) 10 MG tablet Take 10 mg by mouth daily.      . rosuvastatin (CRESTOR) 5 MG tablet Take 5 mg by mouth every evening.        No current  facility-administered medications for this visit.    Allergies  Allergen Reactions  . Codeine Other (See Comments)    Deriviatives, dysfunctional ,woozy,spinning,dizzy    History   Social History  . Marital Status: Married    Spouse Name: N/A    Number of Children: N/A  . Years of Education: N/A   Occupational History  . Not on file.   Social History Main Topics  . Smoking status: Former Smoker -- 1.00 packs/day for 25 years    Quit date: 05/06/2001  . Smokeless tobacco: Never Used  . Alcohol Use: No  . Drug Use: No  . Sexual Activity: Not on file   Other Topics Concern  . Not on file   Social History Narrative  . No narrative on file     Review of Systems: General: negative for chills, fever, night sweats or weight changes.  Cardiovascular: negative for chest pain, dyspnea on exertion, edema, orthopnea, palpitations, paroxysmal nocturnal dyspnea or shortness of breath Dermatological: negative for rash Respiratory: negative for cough or wheezing Urologic: negative for hematuria Abdominal: negative for nausea, vomiting, diarrhea, bright red blood per rectum, melena, or hematemesis Neurologic: negative for visual changes, syncope, or dizziness All other systems reviewed and are otherwise negative except as noted above.    Blood pressure 122/64, pulse  69, height 5\' 11"  (1.803 m), weight 209 lb (94.802 kg).  General appearance: alert and no distress Neck: no adenopathy, no JVD, supple, symmetrical, trachea midline, thyroid not enlarged, symmetric, no tenderness/mass/nodules and soft right carotid bruit Lungs: clear to auscultation bilaterally Heart: regular rate and rhythm, S1, S2 normal, no murmur, click, rub or gallop Extremities: extremities normal, atraumatic, no cyanosis or edema  EKG normal sinus rhythm at 69 with left bundle branch block unchanged from prior EKGs  ASSESSMENT AND PLAN:   Coronary artery disease Status post LAD and circumflex stenting by  Dr. Orlena Sheldon at [facility] in 2004. I catheterized him 03/02/06 revealing patent stents, 40% tapered left main and 50-60% AV groove circumflex marginal stent as well as a 50% lesion in the mid LAD. 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 Medical therapy was recommended. His last Myoview performed/27/12 showed inferolateral scar without ischemia.  Essential hypertension Controlled on current medications  Hyperlipidemia On statin therapy with recent lipid profile performed by Dr. Hyacinth Meeker revealing a total cholesterol of 1:30, LDL of 71 and HDL of 44      Daniel Gess MD [facility], [facility] 01/11/2013 1:41 PM

## 2013-01-11 NOTE — Assessment & Plan Note (Signed)
Status post LAD and circumflex stenting by Dr. Orlena Sheldon at [facility] in 2004. I catheterized him 03/02/06 revealing patent stents, 40% tapered left main and 50-60% AV groove circumflex marginal stent as well as a 50% lesion in the mid LAD. 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 Medical therapy was recommended. His last Myoview performed/27/12 showed inferolateral scar without ischemia.

## 2013-01-11 NOTE — Assessment & Plan Note (Signed)
On statin therapy with recent lipid profile performed by Dr. Hyacinth Meeker revealing a total cholesterol of 1:30, LDL of 71 and HDL of 44

## 2013-01-11 NOTE — Patient Instructions (Signed)
Your physician has requested that you have a carotid duplex. This test is an ultrasound of the carotid arteries in your neck. It looks at blood flow through these arteries that supply the brain with blood. Allow one hour for this exam. There are no restrictions or special instructions.  CONTINUE WITH CURRENT MEDICATION  Your physician wants you to follow-up in 12 MONTHS Dr Allyson Sabal.  You will receive a reminder letter in the mail two months in advance. If you don't receive a letter, please call our office to schedule the follow-up appointment.

## 2013-01-12 ENCOUNTER — Encounter: Payer: Self-pay | Admitting: Internal Medicine

## 2013-01-12 ENCOUNTER — Ambulatory Visit (HOSPITAL_COMMUNITY)
Admission: RE | Admit: 2013-01-12 | Discharge: 2013-01-12 | Disposition: A | Discharge status: Home / Self Care | Payer: Medicare PPO | Source: Ambulatory Visit | Attending: Internal Medicine | Admitting: Internal Medicine

## 2013-01-12 DIAGNOSIS — R0989 Other specified symptoms and signs involving the circulatory and respiratory systems: Secondary | ICD-10-CM

## 2013-01-12 NOTE — Progress Notes (Signed)
Carotid Duple Completed. Brianna L Mazza,RVT

## 2013-01-20 ENCOUNTER — Telehealth: Payer: Self-pay | Admitting: *Deleted

## 2013-01-20 ENCOUNTER — Telehealth: Payer: Self-pay | Admitting: Cardiovascular Disease

## 2013-01-20 DIAGNOSIS — I6529 Occlusion and stenosis of unspecified carotid artery: Secondary | ICD-10-CM

## 2013-01-20 NOTE — Telephone Encounter (Signed)
Had an ultrasound and would like his results .Marland Kitchen Someone called him but he just got back in town   thanks

## 2013-01-20 NOTE — Telephone Encounter (Signed)
Message copied by Marella Bile on Fri Jan 20, 2013  4:03 PM ------      Message from: Runell Gess      Created: Mon Jan 16, 2013  8:35 AM       Moderately abn Carotid doppler. Repeat in 6 months ------

## 2013-01-20 NOTE — Telephone Encounter (Signed)
Returned call and pt verified x 2.  Carotid doppler results given: moderately abnormal, repeat in 6 mos, per Result Note.  Pt verbalized understanding and agreed w/ plan.  Message forwarded to K. Petra Kuba, Charity fundraiser.

## 2013-01-20 NOTE — Telephone Encounter (Signed)
Returned your call .Marland KitchenPlease call back .Marland Kitchen    Thanks

## 2013-01-20 NOTE — Telephone Encounter (Signed)
Order placed for repeat carotid dopplers in 6 months  

## 2013-01-20 NOTE — Telephone Encounter (Signed)
Returned call.  No answer/voicemail.  Will try later.  

## 2013-04-07 ENCOUNTER — Ambulatory Visit: Payer: Self-pay | Admitting: Unknown Physician Specialty

## 2013-04-10 LAB — PATHOLOGY REPORT

## 2013-07-20 ENCOUNTER — Ambulatory Visit (HOSPITAL_COMMUNITY)
Admission: RE | Admit: 2013-07-20 | Discharge: 2013-07-20 | Disposition: A | Discharge status: Home / Self Care | Payer: Medicare Other | Source: Ambulatory Visit | Attending: Cardiovascular Disease | Admitting: Cardiovascular Disease

## 2013-07-20 DIAGNOSIS — I63239 Cerebral infarction due to unspecified occlusion or stenosis of unspecified carotid arteries: Secondary | ICD-10-CM | POA: Insufficient documentation

## 2013-07-20 DIAGNOSIS — I6529 Occlusion and stenosis of unspecified carotid artery: Secondary | ICD-10-CM | POA: Insufficient documentation

## 2013-07-20 NOTE — Progress Notes (Signed)
Carotid Duplex Completed. °Brianna L Mazza,RVT °

## 2013-11-12 ENCOUNTER — Emergency Department: Payer: Self-pay | Admitting: Emergency Medicine

## 2014-01-04 ENCOUNTER — Ambulatory Visit: Payer: Self-pay | Admitting: Internal Medicine

## 2014-01-05 ENCOUNTER — Encounter: Payer: Self-pay | Admitting: Cardiovascular Disease

## 2014-01-05 ENCOUNTER — Ambulatory Visit (INDEPENDENT_AMBULATORY_CARE_PROVIDER_SITE_OTHER): Payer: Medicare Other | Admitting: Cardiovascular Disease

## 2014-01-05 VITALS — BP 110/72 | HR 60 | Ht 71.0 in | Wt 203.0 lb

## 2014-01-05 DIAGNOSIS — I10 Essential (primary) hypertension: Secondary | ICD-10-CM

## 2014-01-05 DIAGNOSIS — I2510 Atherosclerotic heart disease of native coronary artery without angina pectoris: Secondary | ICD-10-CM

## 2014-01-05 DIAGNOSIS — I2583 Coronary atherosclerosis due to lipid rich plaque: Principal | ICD-10-CM

## 2014-01-05 DIAGNOSIS — E785 Hyperlipidemia, unspecified: Secondary | ICD-10-CM

## 2014-01-05 DIAGNOSIS — J61 Pneumoconiosis due to asbestos and other mineral fibers: Secondary | ICD-10-CM | POA: Insufficient documentation

## 2014-01-05 NOTE — Progress Notes (Signed)
01/05/2014 Daniel Hanna   1940-06-18  [id number]  Primary Physician Rusty Aus., MD Primary Cardiologist: Lorretta Harp MD Renae Gloss   HPI:  The patient is a very pleasant 73 year old mildly overweight married Caucasian male, father of 55 and grandfather to 6 grandchildren, whom I last saw on 01/11/13. He had a history of CAD, status post stenting of his circumflex and LAD by Dr. Michael Boston at [facility] in 2004. His other problems include hypertension and hyperlipidemia. I catheterized him on March 02, 2006, revealing patent stents to his proximal LAD and mid circumflex, 30% to 40% tapered distal left main and 50% to 60% AV groove circumflex prior to the stent as well as a 50% lesion in the LAD in the mid portion. 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 Since I last saw him he really denies chest pain or shortness of breath. Dr. Sabra Heck follows his lipid profile. Apparently he recently had an increase in his PSA and saw a urologist over at [facility] and he recommended a prostate biopsy.  Since I saw him one year ago he has been asymptomatic.Dr. Sabra Heck follows his lipid profile closely. He was contemplating having a knee replacement but this never needed to occur since he had improvement in his symptoms with an injection.  Current Outpatient Prescriptions  Medication Sig Dispense Refill  . amoxicillin-clavulanate (AUGMENTIN) 500-125 MG per tablet Take 1 tablet by mouth 2 (two) times daily.    Marland Kitchen aspirin 81 MG tablet Take 81 mg by mouth daily.    . carvedilol (COREG) 6.25 MG tablet Take 6.25 mg by mouth 2 (two) times daily.     . clopidogrel (PLAVIX) 75 MG tablet Take 75 mg by mouth daily.    Marland Kitchen HYDROXYZINE HCL PO Take 25 mg by mouth as needed.     Marland Kitchen lisinopril (PRINIVIL,ZESTRIL) 10 MG tablet Take 10 mg by mouth daily.    .  rosuvastatin (CRESTOR) 5 MG tablet Take 5 mg by mouth every evening.      No current facility-administered medications for this visit.    Allergies  Allergen Reactions  . Codeine Other (See Comments)    Deriviatives, dysfunctional ,woozy,spinning,dizzy    History   Social History  . Marital Status: Married    Spouse Name: N/A    Number of Children: N/A  . Years of Education: N/A   Occupational History  . Not on file.   Social History Main Topics  . Smoking status: Former Smoker -- 1.00 packs/day for 25 years    Quit date: 05/06/2001  . Smokeless tobacco: Never Used  . Alcohol Use: No  . Drug Use: No  . Sexual Activity: Not on file   Other Topics Concern  . Not on file   Social History Narrative     Review of Systems: General: negative for chills, fever, night sweats or weight changes.  Cardiovascular: negative for chest pain, dyspnea on exertion, edema, orthopnea, palpitations, paroxysmal nocturnal dyspnea or shortness of breath Dermatological: negative for rash Respiratory: negative for cough or wheezing Urologic: negative for hematuria Abdominal: negative for nausea, vomiting, diarrhea, bright red blood per rectum, melena, or hematemesis Neurologic: negative for visual changes, syncope, or dizziness All other systems reviewed and are otherwise negative except as noted above.    Blood pressure 110/72, pulse 60, height 5\' 11"  (1.803  m), weight 203 lb (92.08 kg).  General appearance: alert and no distress Neck: no adenopathy, no carotid bruit, no JVD, supple, symmetrical, trachea midline and thyroid not enlarged, symmetric, no tenderness/mass/nodules Lungs: clear to auscultation bilaterally Heart: regular rate and rhythm, S1, S2 normal, no murmur, click, rub or gallop Extremities: extremities normal, atraumatic, no cyanosis or edema  EKG normal sinus rhythm at 60 with left bundle branch block unchanged from prior EKGs. I personally reviewed this EKG  ASSESSMENT  AND PLAN:   Coronary artery disease History of CAD status post stenting of his circumflex and LAD by Dr. Michael Boston at [facility] in 2004. I catheterized him 03/02/06 revealing patent stents to the LAD and mid circumflex, 30-40% tapering distal left main and 50-60% AV groove circumflex prior to the stented segment as well as a 50% lesion in the LAD in its midportion. I could not visualize his right coronary artery because of an aberrant takeoff and I brought him back and was able to visualize it using an AR-2 diagnostic catheter. It arose from the left main. He denies chest pain or shortness of breath. His last Myoview stress test performed 05/30/10 was nonischemic.  Essential hypertension History of hypertension with blood pressure measured today at 110/72. He is on lisinopril 10 mg a day as well as carvedilol 6.25 mg by mouth twice a day. Continue current meds at current dose  Hyperlipidemia History of hyperlipidemia on rosuvastatin 5 mg a day followed by his PCP      Lorretta Harp MD [facility], [facility] 01/05/2014 12:12 PM

## 2014-01-05 NOTE — Patient Instructions (Signed)
Your physician wants you to follow-up in 1 year with Dr. Berry. You will receive a reminder letter in the mail 2 months in advance. If you do not receive a letter, please call our office to schedule the follow-up appointment.  

## 2014-01-05 NOTE — Assessment & Plan Note (Signed)
History of hypertension with blood pressure measured today at 110/72. He is on lisinopril 10 mg a day as well as carvedilol 6.25 mg by mouth twice a day. Continue current meds at current dose

## 2014-01-05 NOTE — Assessment & Plan Note (Signed)
History of CAD status post stenting of his circumflex and LAD by Dr. Michael Boston at [facility] in 2004. I catheterized him 03/02/06 revealing patent stents to the LAD and mid circumflex, 30-40% tapering distal left main and 50-60% AV groove circumflex prior to the stented segment as well as a 50% lesion in the LAD in its midportion. I could not visualize his right coronary artery because of an aberrant takeoff and I brought him back and was able to visualize it using an AR-2 diagnostic catheter. It arose from the left main. He denies chest pain or shortness of breath. His last Myoview stress test performed 05/30/10 was nonischemic.

## 2014-01-05 NOTE — Assessment & Plan Note (Signed)
History of hyperlipidemia on rosuvastatin 5 mg a day followed by his PCP 

## 2014-03-08 ENCOUNTER — Telehealth: Payer: Self-pay | Admitting: Cardiovascular Disease

## 2014-03-08 NOTE — Telephone Encounter (Signed)
She wants to know if you received a fax on pt for Daniel Hanna for surgery? She faxed it on 03-06-14.

## 2014-03-08 NOTE — Telephone Encounter (Signed)
Called office, requested they resend fax request as we do not have it on-hand.

## 2014-03-09 NOTE — Telephone Encounter (Signed)
Patient was seen in the office 01/05/2014. Received fax from [employer] asking for Cardiac Clearance for Knee Makoplasty. Patient is on Plavix.

## 2014-03-09 NOTE — Telephone Encounter (Signed)
Okay to stop his Plavix for his procedure. Cleared from a cardiology standpoint for low-risk procedure

## 2014-03-13 NOTE — Telephone Encounter (Signed)
Cardiac clearance letter and last OV note faxed.

## 2014-03-15 ENCOUNTER — Ambulatory Visit: Payer: Self-pay | Admitting: Unknown Physician Specialty

## 2014-08-09 ENCOUNTER — Telehealth (HOSPITAL_COMMUNITY): Payer: Self-pay | Admitting: *Deleted

## 2014-08-23 ENCOUNTER — Encounter: Payer: Self-pay | Admitting: Cardiovascular Disease

## 2014-10-01 ENCOUNTER — Other Ambulatory Visit: Payer: Self-pay | Admitting: Cardiovascular Disease

## 2014-10-01 DIAGNOSIS — I6523 Occlusion and stenosis of bilateral carotid arteries: Secondary | ICD-10-CM

## 2014-10-05 ENCOUNTER — Ambulatory Visit (HOSPITAL_COMMUNITY)
Admission: RE | Admit: 2014-10-05 | Discharge: 2014-10-05 | Disposition: A | Discharge status: Home / Self Care | Payer: PPO | Source: Ambulatory Visit | Attending: Urology | Admitting: Urology

## 2014-10-05 DIAGNOSIS — E785 Hyperlipidemia, unspecified: Secondary | ICD-10-CM | POA: Diagnosis not present

## 2014-10-05 DIAGNOSIS — I6523 Occlusion and stenosis of bilateral carotid arteries: Secondary | ICD-10-CM | POA: Diagnosis present

## 2014-10-05 DIAGNOSIS — I10 Essential (primary) hypertension: Secondary | ICD-10-CM | POA: Insufficient documentation

## 2014-10-09 ENCOUNTER — Encounter: Payer: Self-pay | Admitting: *Deleted

## 2014-12-18 ENCOUNTER — Encounter: Payer: Self-pay | Admitting: Cardiovascular Disease

## 2015-01-08 ENCOUNTER — Ambulatory Visit: Payer: PPO | Admitting: Cardiovascular Disease

## 2015-01-16 ENCOUNTER — Encounter: Payer: Self-pay | Admitting: Cardiovascular Disease

## 2015-01-16 ENCOUNTER — Ambulatory Visit (INDEPENDENT_AMBULATORY_CARE_PROVIDER_SITE_OTHER): Payer: PPO | Admitting: Cardiovascular Disease

## 2015-01-16 VITALS — BP 152/64 | HR 59 | Ht 71.0 in | Wt 206.3 lb

## 2015-01-16 DIAGNOSIS — I2583 Coronary atherosclerosis due to lipid rich plaque: Principal | ICD-10-CM

## 2015-01-16 DIAGNOSIS — E785 Hyperlipidemia, unspecified: Secondary | ICD-10-CM | POA: Diagnosis not present

## 2015-01-16 DIAGNOSIS — I2510 Atherosclerotic heart disease of native coronary artery without angina pectoris: Secondary | ICD-10-CM

## 2015-01-16 DIAGNOSIS — I739 Peripheral vascular disease, unspecified: Secondary | ICD-10-CM

## 2015-01-16 DIAGNOSIS — I779 Disorder of arteries and arterioles, unspecified: Secondary | ICD-10-CM

## 2015-01-16 DIAGNOSIS — I447 Left bundle-branch block, unspecified: Secondary | ICD-10-CM | POA: Diagnosis not present

## 2015-01-16 DIAGNOSIS — I10 Essential (primary) hypertension: Secondary | ICD-10-CM

## 2015-01-16 NOTE — Assessment & Plan Note (Addendum)
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 This was unchanged from prior studies and will be checked on an annual basis.

## 2015-01-16 NOTE — Assessment & Plan Note (Signed)
History of coronary artery disease status post stenting of circumflex and LAD by Dr. Michael Boston at [facility] in 2004. I catheterized him 03/02/06 revealing patent stents in his proximal LAD and mid circumflex with 30-40% tapering distal left main and 50 is 60% AV groove circumflex prior to the previous stent. 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 He denies chest pain or shortness of breath.

## 2015-01-16 NOTE — Patient Instructions (Signed)

## 2015-01-16 NOTE — Assessment & Plan Note (Signed)
History of hypertension blood pressure measured at 152/64. He is on carvedilol and lisinopril. Continue current meds at current dosing

## 2015-01-16 NOTE — Progress Notes (Signed)
01/16/2015 Daniel Hanna   [birth year]  [id number]  Primary Physician Rusty Aus., MD Primary Cardiologist: Lorretta Harp MD Renae Gloss   HPI:  The patient is a very pleasant 74 year old mildly overweight married Caucasian male, father of 87 and grandfather to 6 grandchildren, whom I last saw on 01/05/14. He had a history of CAD, status post stenting of his circumflex and LAD by Dr. Michael Boston at [facility] in 2004. His other problems include hypertension and hyperlipidemia. I catheterized him on March 02, 2006, revealing patent stents to his proximal LAD and mid circumflex, 30% to 40% tapered distal left main and 50% to 60% AV groove circumflex prior to the stent as well as a 50% lesion in the LAD in the mid portion. 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 Since I last saw him he really denies chest pain or shortness of breath. Dr. Sabra Heck follows his lipid profile. Apparently he recently had an increase in his PSA and saw a urologist over at [facility] and he recommended a prostate biopsy.  Since I saw him one year ago he has been asymptomatic.Dr. Sabra Heck follows his lipid profile closely.    Current Outpatient Prescriptions  Medication Sig Dispense Refill  . aspirin 81 MG tablet Take 81 mg by mouth daily.    . carvedilol (COREG) 6.25 MG tablet Take 6.25 mg by mouth daily.     . clopidogrel (PLAVIX) 75 MG tablet Take 75 mg by mouth daily.    Marland Kitchen HYDROXYZINE HCL PO Take 25 mg by mouth as needed.     Marland Kitchen lisinopril (PRINIVIL,ZESTRIL) 10 MG tablet Take 10 mg by mouth daily.    . rosuvastatin (CRESTOR) 5 MG tablet Take 5 mg by mouth every evening.      No current facility-administered medications for this visit.    Allergies  Allergen Reactions  . Codeine Other (See Comments)    Deriviatives, dysfunctional  ,woozy,spinning,dizzy    Social History   Social History  . Marital Status: Married    Spouse Name: N/A  . Number of Children: N/A  . Years of Education: N/A   Occupational History  . Not on file.   Social History Main Topics  . Smoking status: Former Smoker -- 1.00 packs/day for 25 years    Quit date: 05/06/2001  . Smokeless tobacco: Never Used  . Alcohol Use: No  . Drug Use: No  . Sexual Activity: Not on file   Other Topics Concern  . Not on file   Social History Narrative     Review of Systems: General: negative for chills, fever, night sweats or weight changes.  Cardiovascular: negative for chest pain, dyspnea on exertion, edema, orthopnea, palpitations, paroxysmal nocturnal dyspnea or shortness of breath Dermatological: negative for rash Respiratory: negative for cough or wheezing Urologic: negative for hematuria Abdominal: negative for nausea, vomiting, diarrhea, bright red blood per rectum, melena, or hematemesis Neurologic: negative for visual changes, syncope, or dizziness All other systems reviewed and are otherwise negative except as noted above.    Blood pressure 152/64, pulse 59, height 5\' 11"  (1.803 m), weight 206 lb 4.8 oz (93.577 kg).  General appearance: alert and no distress Neck: no adenopathy, no JVD, supple, symmetrical, trachea midline, thyroid not enlarged, symmetric, no tenderness/mass/nodules and soft right carotid bruit Lungs: clear to auscultation bilaterally Heart: regular rate  and rhythm, S1, S2 normal, no murmur, click, rub or gallop Extremities: extremities normal, atraumatic, no cyanosis or edema  EKG sinus bradycardia 59 with lipoma branch block. I personally reviewed this EKG  ASSESSMENT AND PLAN:   Left bundle branch block chronic  Hyperlipidemia History of hyperlipidemia on statin therapy followed by his PCP  Essential hypertension History of hypertension blood pressure measured at 152/64. He is on carvedilol and  lisinopril. Continue current meds at current dosing  Coronary artery disease History of coronary artery disease status post stenting of circumflex and LAD by Dr. Michael Boston at [facility] in 2004. I catheterized him 03/02/06 revealing patent stents in his proximal LAD and mid circumflex with 30-40% tapering distal left main and 50 is 60% AV groove circumflex prior to the previous stent. 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 He denies chest pain or shortness of breath.  Carotid artery disease ([place]) History of moderate right ICA stenosis by duplex ultrasound recently checked in our office 10/05/14. This was unchanged from prior studies and will be checked on an annual basis.      Lorretta Harp MD FACP,FACC,FAHA, [facility] 01/16/2015 12:08 PM

## 2015-01-16 NOTE — Assessment & Plan Note (Signed)
History of hyperlipidemia on statin therapy followed by his PCP 

## 2015-01-16 NOTE — Assessment & Plan Note (Signed)
chronic

## 2015-02-06 DIAGNOSIS — Z96652 Presence of left artificial knee joint: Secondary | ICD-10-CM | POA: Diagnosis not present

## 2015-02-27 DIAGNOSIS — L578 Other skin changes due to chronic exposure to nonionizing radiation: Secondary | ICD-10-CM | POA: Diagnosis not present

## 2015-02-27 DIAGNOSIS — L814 Other melanin hyperpigmentation: Secondary | ICD-10-CM | POA: Diagnosis not present

## 2015-02-27 DIAGNOSIS — C44212 Basal cell carcinoma of skin of right ear and external auricular canal: Secondary | ICD-10-CM | POA: Diagnosis not present

## 2015-02-27 DIAGNOSIS — L908 Other atrophic disorders of skin: Secondary | ICD-10-CM | POA: Diagnosis not present

## 2015-04-04 DIAGNOSIS — Z85828 Personal history of other malignant neoplasm of skin: Secondary | ICD-10-CM | POA: Diagnosis not present

## 2015-04-04 DIAGNOSIS — L570 Actinic keratosis: Secondary | ICD-10-CM | POA: Diagnosis not present

## 2015-04-04 DIAGNOSIS — L814 Other melanin hyperpigmentation: Secondary | ICD-10-CM | POA: Diagnosis not present

## 2015-04-04 DIAGNOSIS — Z1283 Encounter for screening for malignant neoplasm of skin: Secondary | ICD-10-CM | POA: Diagnosis not present

## 2015-04-04 DIAGNOSIS — D229 Melanocytic nevi, unspecified: Secondary | ICD-10-CM | POA: Diagnosis not present

## 2015-04-04 DIAGNOSIS — Z08 Encounter for follow-up examination after completed treatment for malignant neoplasm: Secondary | ICD-10-CM | POA: Diagnosis not present

## 2015-04-04 DIAGNOSIS — L821 Other seborrheic keratosis: Secondary | ICD-10-CM | POA: Diagnosis not present

## 2015-04-16 DIAGNOSIS — M79674 Pain in right toe(s): Secondary | ICD-10-CM | POA: Diagnosis not present

## 2015-04-16 DIAGNOSIS — B351 Tinea unguium: Secondary | ICD-10-CM | POA: Diagnosis not present

## 2015-06-24 DIAGNOSIS — C61 Malignant neoplasm of prostate: Secondary | ICD-10-CM | POA: Diagnosis not present

## 2015-06-24 DIAGNOSIS — I2510 Atherosclerotic heart disease of native coronary artery without angina pectoris: Secondary | ICD-10-CM | POA: Diagnosis not present

## 2015-07-10 DIAGNOSIS — D649 Anemia, unspecified: Secondary | ICD-10-CM | POA: Diagnosis not present

## 2015-07-10 DIAGNOSIS — C61 Malignant neoplasm of prostate: Secondary | ICD-10-CM | POA: Diagnosis not present

## 2015-07-10 DIAGNOSIS — J61 Pneumoconiosis due to asbestos and other mineral fibers: Secondary | ICD-10-CM | POA: Diagnosis not present

## 2015-07-10 DIAGNOSIS — E782 Mixed hyperlipidemia: Secondary | ICD-10-CM | POA: Insufficient documentation

## 2015-07-10 DIAGNOSIS — Z0000 Encounter for general adult medical examination without abnormal findings: Secondary | ICD-10-CM | POA: Diagnosis not present

## 2015-07-10 DIAGNOSIS — I6522 Occlusion and stenosis of left carotid artery: Secondary | ICD-10-CM | POA: Insufficient documentation

## 2015-07-12 DIAGNOSIS — C44629 Squamous cell carcinoma of skin of left upper limb, including shoulder: Secondary | ICD-10-CM | POA: Diagnosis not present

## 2015-07-12 DIAGNOSIS — D485 Neoplasm of uncertain behavior of skin: Secondary | ICD-10-CM | POA: Diagnosis not present

## 2015-08-05 DIAGNOSIS — C44629 Squamous cell carcinoma of skin of left upper limb, including shoulder: Secondary | ICD-10-CM | POA: Diagnosis not present

## 2015-08-07 DIAGNOSIS — D509 Iron deficiency anemia, unspecified: Secondary | ICD-10-CM | POA: Diagnosis not present

## 2015-08-14 DIAGNOSIS — D509 Iron deficiency anemia, unspecified: Secondary | ICD-10-CM | POA: Diagnosis not present

## 2015-08-30 ENCOUNTER — Telehealth: Payer: Self-pay | Admitting: *Deleted

## 2015-08-30 NOTE — Telephone Encounter (Signed)
Okay to interrupt Plavix for colonoscopy 

## 2015-08-30 NOTE — Telephone Encounter (Signed)
Clearance routed to number provided via EPIC fax. 

## 2015-08-30 NOTE — Telephone Encounter (Signed)
Requesting CARDIAC clearance:   1. Type of surgery: colonoscopy   2. Surgeon: Dr Keith Rake   3. Surgical date: TBD (approx. 09/23/15)  4. Medications that need to be held: Plavix and aspirin  5. CAD: Yes     6. I will defer to: Dr El[facility] Phone [phone] [phone]

## 2015-09-05 NOTE — Telephone Encounter (Signed)
Received a second request for clearance for patient from [facility]. This encounter re-routed to [phone] from [facility].

## 2015-09-09 DIAGNOSIS — L570 Actinic keratosis: Secondary | ICD-10-CM | POA: Diagnosis not present

## 2015-09-16 DIAGNOSIS — D509 Iron deficiency anemia, unspecified: Secondary | ICD-10-CM | POA: Diagnosis not present

## 2015-09-20 ENCOUNTER — Encounter: Payer: Self-pay | Admitting: *Deleted

## 2015-09-23 ENCOUNTER — Ambulatory Visit
Admission: RE | Admit: 2015-09-23 | Discharge: 2015-09-23 | Disposition: A | Discharge status: Home / Self Care | Payer: PPO | Source: Ambulatory Visit | Attending: Unknown Physician Specialty | Admitting: Unknown Physician Specialty

## 2015-09-23 ENCOUNTER — Encounter
Admission: RE | Disposition: A | Discharge status: Home / Self Care | Payer: Self-pay | Source: Ambulatory Visit | Attending: Unknown Physician Specialty

## 2015-09-23 ENCOUNTER — Ambulatory Visit: Payer: PPO | Admitting: Anesthesiology

## 2015-09-23 DIAGNOSIS — Z7982 Long term (current) use of aspirin: Secondary | ICD-10-CM | POA: Diagnosis not present

## 2015-09-23 DIAGNOSIS — I2510 Atherosclerotic heart disease of native coronary artery without angina pectoris: Secondary | ICD-10-CM | POA: Insufficient documentation

## 2015-09-23 DIAGNOSIS — K2950 Unspecified chronic gastritis without bleeding: Secondary | ICD-10-CM | POA: Diagnosis not present

## 2015-09-23 DIAGNOSIS — K2960 Other gastritis without bleeding: Secondary | ICD-10-CM | POA: Diagnosis not present

## 2015-09-23 DIAGNOSIS — Z7902 Long term (current) use of antithrombotics/antiplatelets: Secondary | ICD-10-CM | POA: Diagnosis not present

## 2015-09-23 DIAGNOSIS — E7800 Pure hypercholesterolemia, unspecified: Secondary | ICD-10-CM | POA: Diagnosis not present

## 2015-09-23 DIAGNOSIS — K2990 Gastroduodenitis, unspecified, without bleeding: Secondary | ICD-10-CM | POA: Diagnosis not present

## 2015-09-23 DIAGNOSIS — D649 Anemia, unspecified: Secondary | ICD-10-CM | POA: Diagnosis not present

## 2015-09-23 DIAGNOSIS — K640 First degree hemorrhoids: Secondary | ICD-10-CM | POA: Diagnosis not present

## 2015-09-23 DIAGNOSIS — I447 Left bundle-branch block, unspecified: Secondary | ICD-10-CM | POA: Insufficient documentation

## 2015-09-23 DIAGNOSIS — I252 Old myocardial infarction: Secondary | ICD-10-CM | POA: Insufficient documentation

## 2015-09-23 DIAGNOSIS — I739 Peripheral vascular disease, unspecified: Secondary | ICD-10-CM | POA: Diagnosis not present

## 2015-09-23 DIAGNOSIS — Z955 Presence of coronary angioplasty implant and graft: Secondary | ICD-10-CM | POA: Insufficient documentation

## 2015-09-23 DIAGNOSIS — K5730 Diverticulosis of large intestine without perforation or abscess without bleeding: Secondary | ICD-10-CM | POA: Diagnosis not present

## 2015-09-23 DIAGNOSIS — Z79899 Other long term (current) drug therapy: Secondary | ICD-10-CM | POA: Diagnosis not present

## 2015-09-23 DIAGNOSIS — D500 Iron deficiency anemia secondary to blood loss (chronic): Secondary | ICD-10-CM | POA: Diagnosis not present

## 2015-09-23 DIAGNOSIS — Z8546 Personal history of malignant neoplasm of prostate: Secondary | ICD-10-CM | POA: Insufficient documentation

## 2015-09-23 DIAGNOSIS — N529 Male erectile dysfunction, unspecified: Secondary | ICD-10-CM | POA: Diagnosis not present

## 2015-09-23 DIAGNOSIS — K449 Diaphragmatic hernia without obstruction or gangrene: Secondary | ICD-10-CM | POA: Insufficient documentation

## 2015-09-23 DIAGNOSIS — A048 Other specified bacterial intestinal infections: Secondary | ICD-10-CM | POA: Insufficient documentation

## 2015-09-23 DIAGNOSIS — K5790 Diverticulosis of intestine, part unspecified, without perforation or abscess without bleeding: Secondary | ICD-10-CM | POA: Diagnosis not present

## 2015-09-23 DIAGNOSIS — Z885 Allergy status to narcotic agent status: Secondary | ICD-10-CM | POA: Diagnosis not present

## 2015-09-23 DIAGNOSIS — I10 Essential (primary) hypertension: Secondary | ICD-10-CM | POA: Insufficient documentation

## 2015-09-23 DIAGNOSIS — K648 Other hemorrhoids: Secondary | ICD-10-CM | POA: Diagnosis not present

## 2015-09-23 DIAGNOSIS — R195 Other fecal abnormalities: Secondary | ICD-10-CM | POA: Diagnosis not present

## 2015-09-23 DIAGNOSIS — I499 Cardiac arrhythmia, unspecified: Secondary | ICD-10-CM | POA: Diagnosis not present

## 2015-09-23 DIAGNOSIS — Z87891 Personal history of nicotine dependence: Secondary | ICD-10-CM | POA: Insufficient documentation

## 2015-09-23 DIAGNOSIS — B9681 Helicobacter pylori [H. pylori] as the cause of diseases classified elsewhere: Secondary | ICD-10-CM | POA: Diagnosis not present

## 2015-09-23 DIAGNOSIS — K2970 Gastritis, unspecified, without bleeding: Secondary | ICD-10-CM | POA: Diagnosis not present

## 2015-09-23 DIAGNOSIS — K2980 Duodenitis without bleeding: Secondary | ICD-10-CM | POA: Diagnosis not present

## 2015-09-23 HISTORY — PX: COLONOSCOPY WITH PROPOFOL: SHX5780

## 2015-09-23 HISTORY — PX: ESOPHAGOGASTRODUODENOSCOPY (EGD) WITH PROPOFOL: SHX5813

## 2015-09-23 SURGERY — COLONOSCOPY WITH PROPOFOL
Anesthesia: General

## 2015-09-23 MED ORDER — SODIUM CHLORIDE 0.9 % IV SOLN: INTRAVENOUS | Status: DC

## 2015-09-23 MED ORDER — MIDAZOLAM HCL 2 MG/2ML IJ SOLN: INTRAMUSCULAR | Status: DC | PRN

## 2015-09-23 MED ORDER — PROPOFOL 500 MG/50ML IV EMUL
INTRAVENOUS | Status: DC | PRN
  Administered 2015-09-23: 100 ug/kg/min via INTRAVENOUS

## 2015-09-23 MED ORDER — MIDAZOLAM HCL 2 MG/2ML IJ SOLN
INTRAMUSCULAR | Status: DC | PRN
  Administered 2015-09-23: 1 mg via INTRAVENOUS

## 2015-09-23 MED ORDER — EPHEDRINE SULFATE 50 MG/ML IJ SOLN
INTRAMUSCULAR | Status: DC | PRN
  Administered 2015-09-23: 5 mg via INTRAVENOUS

## 2015-09-23 MED ORDER — SODIUM CHLORIDE 0.9 % IV SOLN
INTRAVENOUS | Status: DC
  Administered 2015-09-23: 1000 mL via INTRAVENOUS

## 2015-09-23 MED ORDER — PIPERACILLIN-TAZOBACTAM 3.375 G IVPB 30 MIN
3.3750 g | Freq: Once | INTRAVENOUS | Status: AC
  Administered 2015-09-23: 3.375 g via INTRAVENOUS
  Filled 2015-09-23: qty 50

## 2015-09-23 MED ORDER — FENTANYL CITRATE (PF) 100 MCG/2ML IJ SOLN
INTRAMUSCULAR | Status: DC | PRN
Start: 2015-09-23 — End: 2015-09-23
  Administered 2015-09-23: 50 ug via INTRAVENOUS

## 2015-09-23 NOTE — Transfer of Care (Signed)
Immediate Anesthesia Transfer of Care Note  Patient: Daniel Hanna  Procedure(s) Performed: Procedure(s): COLONOSCOPY WITH PROPOFOL (N/A) ESOPHAGOGASTRODUODENOSCOPY (EGD) WITH PROPOFOL (N/A)  Patient Location: PACU  Anesthesia Type:General  Level of Consciousness: awake and sedated  Airway & Oxygen Therapy: Patient Spontanous Breathing and Patient connected to nasal cannula oxygen  Post-op Assessment: Report given to RN and Post -op Vital signs reviewed and stable  Post vital signs: Reviewed and stable  Last Vitals:  Vitals:   09/23/15 0911  BP: (!) 155/78  Pulse: (!) 54  Resp: 20  Temp: (!) 35.7 C    Last Pain:  Vitals:   09/23/15 0911  TempSrc: Tympanic         Complications: No apparent anesthesia complications

## 2015-09-23 NOTE — H&P (Signed)
Primary Care Physician:  Rusty Aus, MD Primary Gastroenterologist:  Dr. Vira Agar  Pre-Procedure History & Physical: HPI:  Daniel Hanna is a 75 y.o. male is here for an endoscopy and colonoscopy.   Past Medical History:  Diagnosis Date  . Arthritis    fingers  . Coronary artery disease CARDIOLOGIST--   DR BERRY--  LOV NOTE 01-29-2012  W/ CHART  . ED (erectile dysfunction)   . History of coronary artery stent placement    STENTS X2  TO LAD AND CIRCUMFLEX  . History of elevated PSA 2003   PSA=0.95  . History of myocardial infarction DEC 2004   S/P STENT X2  AT [facility]  . Hypercholesterolemia   . Hypertension   . LBBB (left bundle branch block)   . 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 CARDIAC CATHETERIZATION  03-02-2006  DR BERRY   PATENT STENTS/ 30-40% TAPERED DISTAL LEFT MAIN AND 50-60% AV GROOVE CIRCUMFLEX PRIOR TO STENT  &  50% LESION IN MID PORTION OF LAD.  Marland Kitchen CARDIOVASCULAR STRESS TEST  05-30-2010   DR BERRY   LOW RISK SCAN/  MODERATE SIZED INEROLATERAL SCAR WITHOUT ISCHEMIA / ISCHEMIA/ NO SIGNIFICANT CHANGE FROM PREVIOUS STUDY/   . CORONARY ANGIOPLASTY WITH STENT PLACEMENT  DEC 2004  AT [facility]   STENTING OF CIRCUMFLEX AND LAD  . HEMORRHOID SURGERY    . INGUINAL HERNIA REPAIR  1980'S  . PROSTATE BIOPSY  02/29/12   ADENOCARCINOMA  . RADIOACTIVE SEED IMPLANT N/A 05/12/2012   Procedure: RADIOACTIVE SEED IMPLANT;  Surgeon: Franchot Gallo, MD;  Location: [facility];  Service: Urology;  Laterality: N/A;  DR PORTABLE/RAD TECH NEEDED  . TRANSTHORACIC ECHOCARDIOGRAM  05-30-2010   MILD CONCENTRIC LVH/ EF 123456 STAGE I DIASTOLIC DYSFUCTION/ MILD MR, TR, & AR/ MILD SCLEROTIC AORTIC VALVE WITHOUT STENOSIS    Prior to Admission medications   Medication Sig Start Date End Date Taking? Authorizing Provider  aspirin 81 MG tablet Take 81 mg by mouth daily.   Yes Historical  Provider, MD  carvedilol (COREG) 6.25 MG tablet Take 6.25 mg by mouth daily.    Yes Historical Provider, MD  clopidogrel (PLAVIX) 75 MG tablet Take 75 mg by mouth daily.   Yes Historical Provider, MD  HYDROXYZINE HCL PO Take 25 mg by mouth as needed.    Yes Historical Provider, MD  lisinopril (PRINIVIL,ZESTRIL) 10 MG tablet Take 10 mg by mouth daily.   Yes Historical Provider, MD  rosuvastatin (CRESTOR) 5 MG tablet Take 5 mg by mouth every evening.    Yes Historical Provider, MD    Allergies as of 09/18/2015 - Review Complete 01/16/2015  Allergen Reaction Noted  . Codeine Other (See Comments) 04/05/2012    Family History  Problem Relation Age of Onset  . Diabetes Mother   . Stroke Mother   . Cancer Sister 52    unknown    Social History   Social History  . Marital status: Married    Spouse name: N/A  . Number of children: N/A  . Years of education: N/A   Occupational History  . Not on file.   Social History Main Topics  . Smoking status: Former Smoker    Packs/day: 1.00    Years: 25.00    Quit date: 05/06/2001  . Smokeless tobacco: Never Used  . Alcohol use No  . Drug use: No  . Sexual activity: Not on  file   Other Topics Concern  . Not on file   Social History Narrative  . No narrative on file    Review of Systems: See HPI, otherwise negative ROS  Physical Exam: BP (!) 155/78   Pulse (!) 54   Temp (!) 96.2 F (35.7 C) (Tympanic)   Resp 20   Ht 5\' 11"  (1.803 m)   Wt 91.6 kg (202 lb)   SpO2 100%   BMI 28.17 kg/m  General:   Alert,  pleasant and cooperative in NAD Head:  Normocephalic and atraumatic. Neck:  Supple; no masses or thyromegaly. Lungs:  Clear throughout to auscultation.    Heart:  Regular rate and rhythm.  1/6 SEM Abdomen:  Soft, nontender and nondistended. Normal bowel sounds, without guarding, and without rebound.   Neurologic:  Alert and  oriented x4;  grossly normal neurologically.  Impression/Plan: Daniel Hanna is here for an  endoscopy and colonoscopy to be performed for iron def anemia and heme positive stool  Risks, benefits, limitations, and alternatives regarding  endoscopy and colonoscopy have been reviewed with the patient.  Questions have been answered.  All parties agreeable.   Gaylyn Cheers, MD  09/23/2015, 9:29 AM

## 2015-09-23 NOTE — Anesthesia Procedure Notes (Signed)
Performed by: COOK-MARTIN, Demitris Pokorny Pre-anesthesia Checklist: Patient identified, Emergency Drugs available, Suction available and Patient being monitored Patient Re-evaluated:Patient Re-evaluated prior to inductionOxygen Delivery Method: Nasal cannula Preoxygenation: Pre-oxygenation with 100% oxygen Intubation Type: IV induction Airway Equipment and Method: Bite block Placement Confirmation: CO2 detector and positive ETCO2     

## 2015-09-23 NOTE — Op Note (Signed)
[facility] Gastroenterology Patient Name: Daniel Hanna Procedure Date: 09/23/2015 9:07 AM MRN: [id number] Account #: [id number] Date of Birth: Jun 29, 1940 Admit Type: Outpatient Age: 75 Room: [facility] ENDO ROOM 1 Gender: Male Note Status: Finalized Procedure:            Colonoscopy Indications:          Heme positive stool, Iron deficiency anemia secondary                        to chronic blood loss Providers:            Manya Silvas, MD Medicines:            Propofol per Anesthesia Complications:        No immediate complications. Procedure:            Pre-Anesthesia Assessment:                       - After reviewing the risks and benefits, the patient                        was deemed in satisfactory condition to undergo the                        procedure.                       After obtaining informed consent, the colonoscope was                        passed under direct vision. Throughout the procedure,                        the patient's blood pressure, pulse, and oxygen                        saturations were monitored continuously. The                        Colonoscope was introduced through the anus and                        advanced to the the cecum, identified by appendiceal                        orifice and ileocecal valve. The colonoscopy was                        somewhat difficult due to multiple diverticula in the                        colon. Successful completion of the procedure was aided                        by lavage. The patient tolerated the procedure well.                        The quality of the bowel preparation was adequate to                        identify polyps 6 mm and larger in  size. Findings:      Multiple small and large-mouthed diverticula were found in the sigmoid       colon, descending colon, transverse colon and ascending colon.      Internal hemorrhoids were found during endoscopy. The hemorrhoids were   small and Grade I (internal hemorrhoids that do not prolapse).      Balls of stool seen in sigmoid and descending colon and these were       washed and moved so I could see the lumen and was able to pass to the       cecum. The viewing from the right colon was better than the left.      No polyps or cancer seen in the colon. Impression:           - Diverticulosis in the sigmoid colon, in the                        descending colon, in the transverse colon and in the                        ascending colon.                       - Internal hemorrhoids.                       - No specimens collected. Recommendation:       - The findings and recommendations were discussed with                        the patient's family. Take iron pills and or see                        hematologist. Manya Silvas, MD 09/23/2015 10:27:51 AM This report has been signed electronically. Number of Addenda: 0 Note Initiated On: 09/23/2015 9:07 AM Scope Withdrawal Time: 0 hours 7 minutes 18 seconds  Total Procedure Duration: 0 hours 21 minutes 26 seconds       [facility]

## 2015-09-23 NOTE — Anesthesia Preprocedure Evaluation (Signed)
Anesthesia Evaluation  Patient identified by MRN, date of birth, ID band Patient awake    Reviewed: Allergy & Precautions, H&P , NPO status , Patient's Chart, lab work & pertinent test results, reviewed documented beta blocker date and time   Airway Mallampati: II   Neck ROM: full    Dental  (+) Upper Dentures, Lower Dentures   Pulmonary neg pulmonary ROS, former smoker,    Pulmonary exam normal        Cardiovascular hypertension, + CAD and + Peripheral Vascular Disease  negative cardio ROS Normal cardiovascular exam+ dysrhythmias  Rhythm:regular Rate:Normal     Neuro/Psych negative neurological ROS  negative psych ROS   GI/Hepatic negative GI ROS, Neg liver ROS,   Endo/Other  negative endocrine ROS  Renal/GU negative Renal ROS  negative genitourinary   Musculoskeletal   Abdominal   Peds  Hematology negative hematology ROS (+)   Anesthesia Other Findings Past Medical History: No date: Arthritis     Comment: fingers CARDIOLOGIST--   DR BERRY--  LOV NOTE 01-29-2012  W/ CHART: Coronary artery disease No date: ED (erectile dysfunction) No date: History of coronary artery stent placement     Comment: STENTS X2  TO LAD AND CIRCUMFLEX 2003: History of elevated PSA     Comment: PSA=0.95 DEC 2004: History of myocardial infarction     Comment: S/P STENT X2  AT [facility] No date: Hypercholesterolemia No date: Hypertension No date: LBBB (left bundle branch block) 02/24/12: Prostate cancer ([place])     Comment: BX=Adenocarcinoma,Gleason 3+3=6,3+4=7 PSA=5.80 Past Surgical History: 03-02-2006  DR BERRY: CARDIAC CATHETERIZATION     Comment: PATENT STENTS/ 30-40% TAPERED DISTAL LEFT MAIN              AND 50-60% AV GROOVE CIRCUMFLEX PRIOR TO STENT               &  50% LESION IN MID PORTION OF LAD. 05-30-2010   DR BERRY: CARDIOVASCULAR STRESS TEST     Comment: LOW RISK SCAN/  MODERATE SIZED INEROLATERAL               SCAR WITHOUT  ISCHEMIA / ISCHEMIA/ NO               SIGNIFICANT CHANGE FROM PREVIOUS STUDY/  DEC 2004  AT [facility]: CORONARY ANGIOPLASTY WITH STENT PLACEMENT     Comment: STENTING OF CIRCUMFLEX AND LAD No date: HEMORRHOID SURGERY 1980'S: INGUINAL HERNIA REPAIR 02/29/12: PROSTATE BIOPSY     Comment: ADENOCARCINOMA 05/12/2012: RADIOACTIVE SEED IMPLANT N/A     Comment: Procedure: RADIOACTIVE SEED IMPLANT;  Surgeon:              Franchot Gallo, MD;  Location: [place];  Service: Urology;  Laterality:              N/A;  DR PORTABLE/RAD TECH NEEDED 05-30-2010: TRANSTHORACIC ECHOCARDIOGRAM     Comment: MILD CONCENTRIC LVH/ EF 123456 STAGE I DIASTOLIC              DYSFUCTION/ MILD MR, TR, & AR/ MILD SCLEROTIC               AORTIC VALVE WITHOUT STENOSIS BMI    Body Mass Index:  28.17 kg/m     Reproductive/Obstetrics  Anesthesia Physical Anesthesia Plan  ASA: III  Anesthesia Plan: General   Post-op Pain Management:    Induction:   Airway Management Planned:   Additional Equipment:   Intra-op Plan:   Post-operative Plan:   Informed Consent: I have reviewed the patients History and Physical, chart, labs and discussed the procedure including the risks, benefits and alternatives for the proposed anesthesia with the patient or authorized representative who has indicated his/her understanding and acceptance.   Dental Advisory Given  Plan Discussed with: CRNA  Anesthesia Plan Comments:         Anesthesia Quick Evaluation

## 2015-09-23 NOTE — Op Note (Signed)
[facility] Gastroenterology Patient Name: Daniel Hanna Procedure Date: 09/23/2015 9:14 AM MRN: [id number] Account #: [id number] Date of Birth: Sep 25, 1940 Admit Type: Outpatient Age: 75 Room: [facility] ENDO ROOM 1 Gender: Male Note Status: Finalized Procedure:            Upper GI endoscopy Indications:          Iron deficiency anemia secondary to chronic blood loss,                        Heme positive stool Providers:            Manya Silvas, MD Referring MD:         Rusty Aus, MD (Referring MD) Medicines:            Propofol per Anesthesia Complications:        No immediate complications. Procedure:            Pre-Anesthesia Assessment:                       - After reviewing the risks and benefits, the patient                        was deemed in satisfactory condition to undergo the                        procedure.                       After obtaining informed consent, the endoscope was                        passed under direct vision. Throughout the procedure,                        the patient's blood pressure, pulse, and oxygen                        saturations were monitored continuously. The Endoscope                        was introduced through the mouth, and advanced to the                        second part of duodenum. The upper GI endoscopy was                        somewhat difficult due to unusual anatomy. The patient                        tolerated the procedure well. Findings:      The examined esophagus was normal. GEJ 39cm.      A small hiatal hernia was present.      Diffuse and patchy mild inflammation characterized by erythema and       granularity was found in the gastric body and in the gastric antrum.       Biopsies were taken with a cold forceps for histology. Biopsies were       taken with a cold forceps for Helicobacter pylori testing.      Diffuse mildly erythematous mucosa without active bleeding and with no  stigmata of bleeding was found in the duodenal bulb. Impression:           - Normal esophagus.                       - Small hiatal hernia.                       - Gastritis. Biopsied.                       - Erythematous duodenopathy. Recommendation:       - Await pathology results.                       - Perform a colonoscopy as previously scheduled.                       - Mechanical soft diet for 3 days. Manya Silvas, MD 09/23/2015 9:59:05 AM This report has been signed electronically. Number of Addenda: 0 Note Initiated On: 09/23/2015 9:14 AM      [facility]

## 2015-09-24 ENCOUNTER — Encounter: Payer: Self-pay | Admitting: Unknown Physician Specialty

## 2015-09-24 LAB — SURGICAL PATHOLOGY

## 2015-09-25 NOTE — Anesthesia Postprocedure Evaluation (Signed)
Anesthesia Post Note  Patient: [name]  Procedure(s) Performed: Procedure(s) (LRB): COLONOSCOPY WITH PROPOFOL (N/A) ESOPHAGOGASTRODUODENOSCOPY (EGD) WITH PROPOFOL (N/A)  Patient location during evaluation: PACU Anesthesia Type: General Level of consciousness: awake and alert Pain management: pain level controlled Vital Signs Assessment: post-procedure vital signs reviewed and stable Respiratory status: spontaneous breathing, nonlabored ventilation, respiratory function stable and patient connected to nasal cannula oxygen Cardiovascular status: blood pressure returned to baseline and stable Postop Assessment: no signs of nausea or vomiting Anesthetic complications: no    Last Vitals:  Vitals:   09/23/15 1035 09/23/15 1045  BP: 123/65 (!) 122/57  Pulse: (!) 55 (!) 56  Resp: 15 16  Temp:      Last Pain:  Vitals:   09/24/15 0736  TempSrc:   PainSc: 0-No pain                 Molli Barrows

## 2015-10-03 DIAGNOSIS — A048 Other specified bacterial intestinal infections: Secondary | ICD-10-CM | POA: Diagnosis not present

## 2015-10-03 DIAGNOSIS — D500 Iron deficiency anemia secondary to blood loss (chronic): Secondary | ICD-10-CM | POA: Diagnosis not present

## 2015-10-03 DIAGNOSIS — R195 Other fecal abnormalities: Secondary | ICD-10-CM | POA: Diagnosis not present

## 2015-10-11 DIAGNOSIS — D500 Iron deficiency anemia secondary to blood loss (chronic): Secondary | ICD-10-CM | POA: Diagnosis not present

## 2015-11-28 DIAGNOSIS — D500 Iron deficiency anemia secondary to blood loss (chronic): Secondary | ICD-10-CM | POA: Diagnosis not present

## 2015-12-09 DIAGNOSIS — H6121 Impacted cerumen, right ear: Secondary | ICD-10-CM | POA: Diagnosis not present

## 2015-12-09 DIAGNOSIS — L570 Actinic keratosis: Secondary | ICD-10-CM | POA: Diagnosis not present

## 2015-12-09 DIAGNOSIS — H6982 Other specified disorders of Eustachian tube, left ear: Secondary | ICD-10-CM | POA: Diagnosis not present

## 2015-12-11 DIAGNOSIS — T07XXXA Unspecified multiple injuries, initial encounter: Secondary | ICD-10-CM | POA: Diagnosis not present

## 2015-12-11 DIAGNOSIS — H353121 Nonexudative age-related macular degeneration, left eye, early dry stage: Secondary | ICD-10-CM | POA: Diagnosis not present

## 2016-01-02 DIAGNOSIS — Z125 Encounter for screening for malignant neoplasm of prostate: Secondary | ICD-10-CM | POA: Diagnosis not present

## 2016-01-02 DIAGNOSIS — E538 Deficiency of other specified B group vitamins: Secondary | ICD-10-CM | POA: Diagnosis not present

## 2016-01-02 DIAGNOSIS — Z0000 Encounter for general adult medical examination without abnormal findings: Secondary | ICD-10-CM | POA: Diagnosis not present

## 2016-01-02 DIAGNOSIS — D500 Iron deficiency anemia secondary to blood loss (chronic): Secondary | ICD-10-CM | POA: Diagnosis not present

## 2016-01-09 DIAGNOSIS — D500 Iron deficiency anemia secondary to blood loss (chronic): Secondary | ICD-10-CM | POA: Diagnosis not present

## 2016-01-09 DIAGNOSIS — I2510 Atherosclerotic heart disease of native coronary artery without angina pectoris: Secondary | ICD-10-CM | POA: Diagnosis not present

## 2016-01-09 DIAGNOSIS — I10 Essential (primary) hypertension: Secondary | ICD-10-CM | POA: Insufficient documentation

## 2016-02-11 DIAGNOSIS — Z85828 Personal history of other malignant neoplasm of skin: Secondary | ICD-10-CM | POA: Diagnosis not present

## 2016-02-11 DIAGNOSIS — Z859 Personal history of malignant neoplasm, unspecified: Secondary | ICD-10-CM | POA: Diagnosis not present

## 2016-02-11 DIAGNOSIS — L570 Actinic keratosis: Secondary | ICD-10-CM | POA: Diagnosis not present

## 2016-03-03 ENCOUNTER — Encounter: Payer: Self-pay | Admitting: Cardiovascular Disease

## 2016-03-03 ENCOUNTER — Ambulatory Visit (INDEPENDENT_AMBULATORY_CARE_PROVIDER_SITE_OTHER): Payer: PPO | Admitting: Cardiovascular Disease

## 2016-03-03 VITALS — BP 168/62 | HR 62 | Ht 71.0 in | Wt 207.6 lb

## 2016-03-03 DIAGNOSIS — I10 Essential (primary) hypertension: Secondary | ICD-10-CM

## 2016-03-03 DIAGNOSIS — I779 Disorder of arteries and arterioles, unspecified: Secondary | ICD-10-CM

## 2016-03-03 DIAGNOSIS — E785 Hyperlipidemia, unspecified: Secondary | ICD-10-CM | POA: Diagnosis not present

## 2016-03-03 DIAGNOSIS — R011 Cardiac murmur, unspecified: Secondary | ICD-10-CM

## 2016-03-03 DIAGNOSIS — E7800 Pure hypercholesterolemia, unspecified: Secondary | ICD-10-CM

## 2016-03-03 DIAGNOSIS — I739 Peripheral vascular disease, unspecified: Secondary | ICD-10-CM

## 2016-03-03 DIAGNOSIS — I447 Left bundle-branch block, unspecified: Secondary | ICD-10-CM

## 2016-03-03 MED ORDER — LISINOPRIL 20 MG PO TABS: 20.0000 mg | ORAL_TABLET | Freq: Every day | ORAL | 6 refills | Status: DC

## 2016-03-03 NOTE — Assessment & Plan Note (Signed)
History of hypertension the blood pressure measured at 168/62. He is on carvedilol and lisinopril. I'm going to increase his lisinopril from 10-20 mg a day. We'll check a basic metabolic panel will have predicted daily blood pressure log for the next month. He will see Erasmo Downer back after that to review his results and make further adjustments.

## 2016-03-03 NOTE — Assessment & Plan Note (Signed)
History of carotid artery disease with carotid Dopplers performed 10/05/14 revealing moderate right ICA stenosis. He issymptomatic. Does have bilateral bruits. We will recheck carotid Doppler studies.

## 2016-03-03 NOTE — Patient Instructions (Signed)
Medication Instructions: Increase Lisinopril to 20 mg daily.   Labwork: Your physician recommends that you return for a FASTING lipid profile, hepatic function panel, and a BMET.   Testing/Procedures: Your physician has requested that you have a carotid duplex. This test is an ultrasound of the carotid arteries in your neck. It looks at blood flow through these arteries that supply the brain with blood. Allow one hour for this exam. There are no restrictions or special instructions.  Your physician has requested that you have an echocardiogram. Echocardiography is a painless test that uses sound waves to create images of your heart. It provides your doctor with information about the size and shape of your heart and how well your heart's chambers and valves are working. This procedure takes approximately one hour. There are no restrictions for this procedure.   Follow-Up: Your physician recommends that you schedule a follow-up appointment in: 1 month with [facility] for blood pressure.  Your physician wants you to follow-up in: 1 year with Dr. Gwenlyn Found. You will receive a reminder letter in the mail two months in advance. If you don't receive a letter, please call our office to schedule the follow-up appointment.  If you need a refill on your cardiac medications before your next appointment, please call your pharmacy.

## 2016-03-03 NOTE — Progress Notes (Signed)
03/03/2016 [name]   1940/10/08  [id number]  Primary Physician Rusty Aus, MD Primary Cardiologist: Lorretta Harp MD Renae Gloss  HPI:    Mr. Daniel Hanna is a very pleasant 76 year old mildly overweight married Caucasian male, father of 71 and grandfather to 6 grandchildren, whom I last saw on 01/16/15. He had a history of CAD, status post stenting of his circumflex and LAD by Dr. Michael Boston at [facility] in 2004. His other problems include hypertension and hyperlipidemia. I catheterized him on March 02, 2006, revealing patent stents to his proximal LAD and mid circumflex, 30% to 40% tapered distal left main and 50% to 60% AV groove circumflex prior to the stent as well as a 50% lesion in the LAD in the mid portion. 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 Since I last saw him he really denies chest pain or shortness of breath. Dr. Sabra Heck follows his lipid profile. Apparently he recently had an increase in his PSA and saw a urologist over at [facility] and he recommended a prostate biopsy.  Since I saw him one year ago he has been asymptomatic.Dr. Sabra Heck follows his lipid profile closely.    Current Outpatient Prescriptions  Medication Sig Dispense Refill  . aspirin 81 MG tablet Take 81 mg by mouth daily.    . carvedilol (COREG) 6.25 MG tablet Take 6.25 mg by mouth daily.     . clopidogrel (PLAVIX) 75 MG tablet Take 75 mg by mouth daily.    Marland Kitchen HYDROXYZINE HCL PO Take 25 mg by mouth as needed.     Marland Kitchen lisinopril (PRINIVIL,ZESTRIL) 20 MG tablet Take 1 tablet (20 mg total) by mouth daily. 30 tablet 6  . pravastatin (PRAVACHOL) 20 MG tablet Take 1 tablet by mouth daily.     No current facility-administered medications for this visit.     Allergies  Allergen Reactions  . Codeine Other (See Comments)    Deriviatives,  dysfunctional ,woozy,spinning,dizzy    Social History   Social History  . Marital status: Married    Spouse name: N/A  . Number of children: N/A  . Years of education: N/A   Occupational History  . Not on file.   Social History Main Topics  . Smoking status: Former Smoker    Packs/day: 1.00    Years: 25.00    Quit date: 05/06/2001  . Smokeless tobacco: Never Used  . Alcohol use No  . Drug use: No  . Sexual activity: Not on file   Other Topics Concern  . Not on file   Social History Narrative  . No narrative on file     Review of Systems: General: negative for chills, fever, night sweats or weight changes.  Cardiovascular: negative for chest pain, dyspnea on exertion, edema, orthopnea, palpitations, paroxysmal nocturnal dyspnea or shortness of breath Dermatological: negative for rash Respiratory: negative for cough or wheezing Urologic: negative for hematuria Abdominal: negative for nausea, vomiting, diarrhea, bright red blood per rectum, melena, or hematemesis Neurologic: negative for visual changes, syncope, or dizziness All other systems reviewed and are otherwise negative except as noted above.    Blood pressure (!) 168/62, pulse 62, height 5\' 11"  (1.803 m), weight 207 lb 9.6 oz (94.2 kg).  General appearance: alert and no distress Neck: no adenopathy, no JVD, supple, symmetrical, trachea midline, thyroid  not enlarged, symmetric, no tenderness/mass/nodules and Soft bilateral carotid bruits Lungs: clear to auscultation bilaterally Heart: Soft outflow tract murmur consistent with aortic stenosis and/or sclerosis Extremities: extremities normal, atraumatic, no cyanosis or edema  EKG normal sinus rhythm at 62 with left bundle branch block unchanged from prior EKGs. I personally reviewed this EKG  ASSESSMENT AND PLAN:   Coronary artery disease History of CAD status post stenting of his circumflex and LAD by Dr. Michael Boston at [facility]  2004 I catheterized him 03/02/06 revealing patent stents in the proximal LAD and mid circumflex. He did have a 30-40% tapered distal left main and a 56% AV groove circumflex proximal to the previously placed stent as well as a 50% lesion in the LAD in the midportion. 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 He denies chest pain.  Essential hypertension History of hypertension the blood pressure measured at 168/62. He is on carvedilol and lisinopril. I'm going to increase his lisinopril from 10-20 mg a day. We'll check a basic metabolic panel will have predicted daily blood pressure log for the next month. He will see Erasmo Downer back after that to review his results and make further adjustments.  Hyperlipidemia History of hyperlipidemia on statin therapy. We will recheck a lipid and liver profile  Left bundle branch block History of left bundle branch block, chronic  Carotid artery disease (HCC) History of carotid artery disease with carotid Dopplers performed 10/05/14 revealing moderate right ICA stenosis. He issymptomatic. Does have bilateral bruits. We will recheck carotid Doppler studies.      Lorretta Harp MD FACP,FACC,FAHA, [facility] 03/03/2016 10:12 AM

## 2016-03-03 NOTE — Assessment & Plan Note (Signed)
History of left bundle branch block, chronic 

## 2016-03-03 NOTE — Assessment & Plan Note (Signed)
History of CAD status post stenting of his circumflex and LAD by Dr. Michael Boston at [facility] 2004 I catheterized him 03/02/06 revealing patent stents in the proximal LAD and mid circumflex. He did have a 30-40% tapered distal left main and a 56% AV groove circumflex proximal to the previously placed stent as well as a 50% lesion in the LAD in the midportion. 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 He denies chest pain.

## 2016-03-03 NOTE — Assessment & Plan Note (Signed)
History of hyperlipidemia on statin therapy. We will recheck a lipid and liver profile 

## 2016-03-24 ENCOUNTER — Other Ambulatory Visit: Payer: PPO | Admitting: *Deleted

## 2016-03-24 ENCOUNTER — Ambulatory Visit (HOSPITAL_COMMUNITY)
Admission: RE | Admit: 2016-03-24 | Discharge: 2016-03-24 | Disposition: A | Discharge status: Home / Self Care | Payer: PPO | Source: Ambulatory Visit | Attending: Cardiology | Admitting: Cardiology

## 2016-03-24 ENCOUNTER — Other Ambulatory Visit: Payer: Self-pay

## 2016-03-24 ENCOUNTER — Ambulatory Visit (HOSPITAL_BASED_OUTPATIENT_CLINIC_OR_DEPARTMENT_OTHER): Payer: PPO

## 2016-03-24 DIAGNOSIS — I10 Essential (primary) hypertension: Secondary | ICD-10-CM

## 2016-03-24 DIAGNOSIS — I083 Combined rheumatic disorders of mitral, aortic and tricuspid valves: Secondary | ICD-10-CM | POA: Diagnosis not present

## 2016-03-24 DIAGNOSIS — R011 Cardiac murmur, unspecified: Secondary | ICD-10-CM | POA: Insufficient documentation

## 2016-03-24 DIAGNOSIS — E7800 Pure hypercholesterolemia, unspecified: Secondary | ICD-10-CM | POA: Diagnosis not present

## 2016-03-24 DIAGNOSIS — I2510 Atherosclerotic heart disease of native coronary artery without angina pectoris: Secondary | ICD-10-CM

## 2016-03-24 DIAGNOSIS — I739 Peripheral vascular disease, unspecified: Secondary | ICD-10-CM

## 2016-03-24 DIAGNOSIS — I6523 Occlusion and stenosis of bilateral carotid arteries: Secondary | ICD-10-CM | POA: Diagnosis not present

## 2016-03-24 DIAGNOSIS — I779 Disorder of arteries and arterioles, unspecified: Secondary | ICD-10-CM

## 2016-03-24 LAB — HEPATIC FUNCTION PANEL
ALBUMIN: 4.3 g/dL (ref 3.5–4.8)
ALK PHOS: 62 IU/L (ref 39–117)
ALT: 16 IU/L (ref 0–44)
AST: 18 IU/L (ref 0–40)
BILIRUBIN TOTAL: 0.6 mg/dL (ref 0.0–1.2)
BILIRUBIN, DIRECT: 0.14 mg/dL (ref 0.00–0.40)
Total Protein: 7.4 g/dL (ref 6.0–8.5)

## 2016-03-24 LAB — BASIC METABOLIC PANEL
BUN / CREAT RATIO: 13 (ref 10–24)
BUN: 12 mg/dL (ref 8–27)
CO2: 22 mmol/L (ref 18–29)
CREATININE: 0.93 mg/dL (ref 0.76–1.27)
Calcium: 9.4 mg/dL (ref 8.6–10.2)
Chloride: 101 mmol/L (ref 96–106)
GFR calc Af Amer: 91 (ref >59)
GFR calc non Af Amer: 79 (ref >59)
Glucose: 117 mg/dL — ABNORMAL HIGH (ref 65–99)
Potassium: 3.9 mmol/L (ref 3.5–5.2)
SODIUM: 141 mmol/L (ref 134–144)

## 2016-03-24 LAB — LIPID PANEL
CHOLESTEROL TOTAL: 161 mg/dL (ref 100–199)
Chol/HDL Ratio: 3 (ref 0.0–5.0)
HDL: 53 mg/dL (ref >39)
LDL CALC: 76 (ref 0–99)
TRIGLYCERIDES: 161 mg/dL — AB (ref 0–149)
VLDL Cholesterol Cal: 32 (ref 5–40)

## 2016-03-24 NOTE — Addendum Note (Signed)
Addended by: Eulis Foster on: 03/24/2016 08:09 AM   Modules accepted: Orders

## 2016-03-26 ENCOUNTER — Other Ambulatory Visit: Payer: Self-pay | Admitting: Cardiovascular Disease

## 2016-03-26 ENCOUNTER — Encounter: Payer: Self-pay | Admitting: Cardiovascular Disease

## 2016-03-26 DIAGNOSIS — I779 Disorder of arteries and arterioles, unspecified: Secondary | ICD-10-CM

## 2016-03-26 DIAGNOSIS — I739 Peripheral vascular disease, unspecified: Principal | ICD-10-CM

## 2016-04-02 ENCOUNTER — Ambulatory Visit (INDEPENDENT_AMBULATORY_CARE_PROVIDER_SITE_OTHER): Payer: PPO | Admitting: Pharmacist

## 2016-04-02 VITALS — BP 162/80 | HR 62

## 2016-04-02 DIAGNOSIS — I10 Essential (primary) hypertension: Secondary | ICD-10-CM

## 2016-04-02 NOTE — Patient Instructions (Addendum)
Return for a  follow up appointment in as needed (call pharmacist clinic at [phone])  Your blood pressure today is 162/80 pulse 62  Check your blood pressure at home daily (if able) and keep record of the readings.  Take your BP meds as follows:  Lisinopril 20mg  daily Carvedilol 6.25mg  daily  **Continue with lifestyle modification like walking, low sodium/low salt diet, weight loss**  **Call clinic if BP >150 consistent**  Bring all of your meds, your BP cuff and your record of home blood pressures to your next appointment.  Exercise as you're able, try to walk approximately 30 minutes per day.  Keep salt intake to a minimum, especially watch canned and prepared boxed foods.  Eat more fresh fruits and vegetables and fewer canned items.  Avoid eating in fast food restaurants.    HOW TO TAKE YOUR BLOOD PRESSURE: . Rest 5 minutes before taking your blood pressure. .  Don't smoke or drink caffeinated beverages for at least 30 minutes before. . Take your blood pressure before (not after) you eat. . Sit comfortably with your back supported and both feet on the floor (don't cross your legs). . Elevate your arm to heart level on a table or a desk. . Use the proper sized cuff. It should fit smoothly and snugly around your bare upper arm. There should be enough room to slip a fingertip under the cuff. The bottom edge of the cuff should be 1 inch above the crease of the elbow. . Ideally, take 3 measurements at one sitting and record the average.

## 2016-04-02 NOTE — Progress Notes (Signed)
Patient ID: Daniel Hanna                 DOB: 05-04-1940                      MRN: [id number]     HPI: Daniel Hanna is a 76 y.o. male referred by Dr. Gwenlyn Found to HTN clinic. PMI includes CAD s/p stent in 2004 uncontrolled hypertension, and hyperlipidemia.  Lisinopril dose was increased from 10mg  to 20mg  daily by Dr Gwenlyn Found on 03/03/16. BMET completed on 03/24/2016 show renal function and electrolyte within normal limits. Patient presents today for follow up. He was under the impression he was to see Dr Gwenlyn Found today to get his results from most recent ECHOCARDIOGRAM, and not expecting to see the pharmacist for blood pressure management.   Denies headaches, dizziness, fatigue or swelling.  Patient reports a weight gain of 6-8 lbs in the last 2 months. He is back taking Lisinopril 10mg  tablets because he understood the instructions to increase Lisinopril to 20mg  only for 30 days.  Current HTN meds:  Lisinopril 20mg  daily  Carvedilol 6.25mg  daily  BP goal: <130/80  Social History: Former smoker, denies smokeless tobacco, alcohol use or illicit durgs  Diet: Recently cut back on salt, processed meats, and pork  Exercise: limited physical activity due to weather change, just startingback with walking and working in his work shop.  Home BP readings: none available; stated is usually 123456 systolic and [id number] diastolic  Wt Readings from Last 3 Encounters:  03/03/16 207 lb 9.6 oz (94.2 kg)  09/23/15 202 lb (91.6 kg)  01/16/15 206 lb 4.8 oz (93.6 kg)   BP Readings from Last 3 Encounters:  04/02/16 (!) 162/80  03/03/16 (!) 168/62  09/23/15 (!) 122/57   Pulse Readings from Last 3 Encounters:  04/02/16 62  03/03/16 62  09/23/15 (!) 56    Past Medical History:  Diagnosis Date  . Arthritis    fingers  . Coronary artery disease CARDIOLOGIST--   DR BERRY--  LOV NOTE 01-29-2012  W/ CHART  . ED (erectile dysfunction)   . History of coronary artery stent placement    STENTS X2  TO LAD AND CIRCUMFLEX  .  History of elevated PSA 2003   PSA=0.95  . History of myocardial infarction DEC 2004   S/P STENT X2  AT [facility]  . Hypercholesterolemia   . Hypertension   . LBBB (left bundle branch block)   . 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 aspirin 81 MG tablet Take 81 mg by mouth daily.    . carvedilol (COREG) 6.25 MG tablet Take 6.25 mg by mouth daily.     . clopidogrel (PLAVIX) 75 MG tablet Take 75 mg by mouth daily.    Marland Kitchen HYDROXYZINE HCL PO Take 25 mg by mouth as needed.     Marland Kitchen lisinopril (PRINIVIL,ZESTRIL) 20 MG tablet Take 1 tablet (20 mg total) by mouth daily. 30 tablet 6  . pravastatin (PRAVACHOL) 20 MG tablet Take 1 tablet by mouth daily.     No current facility-administered medications on file prior to visit.     Allergies  Allergen Reactions  . Codeine Other (See Comments)    Deriviatives, dysfunctional ,woozy,spinning,dizzy    Blood pressure (!) 162/80, pulse 62, SpO2 96 %.  Hypertension:  Blood pressure remind above goal but patient reluctant to  make significant medication changes. Original recommendation was to increase Lisinopril to 30mg  daily but patient will prefer a less aggressive approach.  Noted patient resumed is Lisinopril 10mg  after understanding instructions wrong.  Will resume Lisinopril 20mg  every morning, continue carvedilol 6.25mg  daily, and continue to work on lifestyle modifications like daily walks, low salt/low sodium diet, and weight loss.  Patient to continue daily monitoring of his blood pressure and call clinic if SBP consistently >150 or new symptoms experienced.  Plan to follow up by phone in 3-4 weeks for determine if patient willing to schedule another follow up with HTN clinic.   Charday Capetillo Rodriguez-Guzman PharmD, [place] [address] 04/02/2016 5:17 PM

## 2016-04-08 ENCOUNTER — Other Ambulatory Visit: Payer: Self-pay | Admitting: Cardiovascular Disease

## 2016-04-08 ENCOUNTER — Telehealth: Payer: Self-pay | Admitting: Cardiovascular Disease

## 2016-04-08 DIAGNOSIS — I350 Nonrheumatic aortic (valve) stenosis: Secondary | ICD-10-CM

## 2016-04-08 NOTE — Telephone Encounter (Signed)
ECHOCARDIOGRAM COMPLETE  Order: [id number]  Status:  Final result Visible to patient:  No (Not Released) Dx:  Murmur  Notes Recorded by Therisa Doyne on 04/08/2016 at 4:19 PM EST Results given to pt. Pt verbalized understanding. Repeat order entered.  ------  Notes Recorded by Lorretta Harp, MD on 04/04/2016 at 4:12 PM EST Mod LV dysfunction and mod AS. Repeat 12 months

## 2016-04-30 DIAGNOSIS — H6121 Impacted cerumen, right ear: Secondary | ICD-10-CM | POA: Diagnosis not present

## 2016-04-30 DIAGNOSIS — H93291 Other abnormal auditory perceptions, right ear: Secondary | ICD-10-CM | POA: Diagnosis not present

## 2016-07-06 DIAGNOSIS — I2510 Atherosclerotic heart disease of native coronary artery without angina pectoris: Secondary | ICD-10-CM | POA: Diagnosis not present

## 2016-07-06 DIAGNOSIS — D500 Iron deficiency anemia secondary to blood loss (chronic): Secondary | ICD-10-CM | POA: Diagnosis not present

## 2016-07-06 DIAGNOSIS — Z125 Encounter for screening for malignant neoplasm of prostate: Secondary | ICD-10-CM | POA: Diagnosis not present

## 2016-07-13 DIAGNOSIS — C61 Malignant neoplasm of prostate: Secondary | ICD-10-CM | POA: Diagnosis not present

## 2016-07-13 DIAGNOSIS — Z0000 Encounter for general adult medical examination without abnormal findings: Secondary | ICD-10-CM | POA: Diagnosis not present

## 2016-07-13 DIAGNOSIS — E538 Deficiency of other specified B group vitamins: Secondary | ICD-10-CM | POA: Diagnosis not present

## 2016-07-13 DIAGNOSIS — D500 Iron deficiency anemia secondary to blood loss (chronic): Secondary | ICD-10-CM | POA: Diagnosis not present

## 2016-07-13 DIAGNOSIS — I350 Nonrheumatic aortic (valve) stenosis: Secondary | ICD-10-CM | POA: Insufficient documentation

## 2016-07-13 DIAGNOSIS — E782 Mixed hyperlipidemia: Secondary | ICD-10-CM | POA: Diagnosis not present

## 2016-08-06 DIAGNOSIS — S80261A Insect bite (nonvenomous), right knee, initial encounter: Secondary | ICD-10-CM | POA: Diagnosis not present

## 2016-09-30 DIAGNOSIS — H25813 Combined forms of age-related cataract, bilateral: Secondary | ICD-10-CM | POA: Diagnosis not present

## 2016-11-26 DIAGNOSIS — H6121 Impacted cerumen, right ear: Secondary | ICD-10-CM | POA: Diagnosis not present

## 2016-11-26 DIAGNOSIS — H60391 Other infective otitis externa, right ear: Secondary | ICD-10-CM | POA: Diagnosis not present

## 2017-01-06 DIAGNOSIS — E538 Deficiency of other specified B group vitamins: Secondary | ICD-10-CM | POA: Diagnosis not present

## 2017-01-06 DIAGNOSIS — C61 Malignant neoplasm of prostate: Secondary | ICD-10-CM | POA: Diagnosis not present

## 2017-01-06 DIAGNOSIS — E782 Mixed hyperlipidemia: Secondary | ICD-10-CM | POA: Diagnosis not present

## 2017-01-06 DIAGNOSIS — D500 Iron deficiency anemia secondary to blood loss (chronic): Secondary | ICD-10-CM | POA: Diagnosis not present

## 2017-01-13 DIAGNOSIS — D500 Iron deficiency anemia secondary to blood loss (chronic): Secondary | ICD-10-CM | POA: Diagnosis not present

## 2017-01-13 DIAGNOSIS — I350 Nonrheumatic aortic (valve) stenosis: Secondary | ICD-10-CM | POA: Diagnosis not present

## 2017-01-13 DIAGNOSIS — E538 Deficiency of other specified B group vitamins: Secondary | ICD-10-CM | POA: Diagnosis not present

## 2017-01-13 DIAGNOSIS — I2510 Atherosclerotic heart disease of native coronary artery without angina pectoris: Secondary | ICD-10-CM | POA: Diagnosis not present

## 2017-01-13 DIAGNOSIS — E782 Mixed hyperlipidemia: Secondary | ICD-10-CM | POA: Diagnosis not present

## 2017-01-13 DIAGNOSIS — Z125 Encounter for screening for malignant neoplasm of prostate: Secondary | ICD-10-CM | POA: Diagnosis not present

## 2017-02-10 DIAGNOSIS — Z859 Personal history of malignant neoplasm, unspecified: Secondary | ICD-10-CM | POA: Diagnosis not present

## 2017-02-10 DIAGNOSIS — L570 Actinic keratosis: Secondary | ICD-10-CM | POA: Diagnosis not present

## 2017-02-10 DIAGNOSIS — L578 Other skin changes due to chronic exposure to nonionizing radiation: Secondary | ICD-10-CM | POA: Diagnosis not present

## 2017-02-10 DIAGNOSIS — L821 Other seborrheic keratosis: Secondary | ICD-10-CM | POA: Diagnosis not present

## 2017-02-10 DIAGNOSIS — Z85828 Personal history of other malignant neoplasm of skin: Secondary | ICD-10-CM | POA: Diagnosis not present

## 2017-02-10 DIAGNOSIS — D485 Neoplasm of uncertain behavior of skin: Secondary | ICD-10-CM | POA: Diagnosis not present

## 2017-02-23 DIAGNOSIS — C44622 Squamous cell carcinoma of skin of right upper limb, including shoulder: Secondary | ICD-10-CM | POA: Diagnosis not present

## 2017-02-23 DIAGNOSIS — D485 Neoplasm of uncertain behavior of skin: Secondary | ICD-10-CM | POA: Diagnosis not present

## 2017-03-09 DIAGNOSIS — C44622 Squamous cell carcinoma of skin of right upper limb, including shoulder: Secondary | ICD-10-CM | POA: Diagnosis not present

## 2017-04-05 ENCOUNTER — Ambulatory Visit (HOSPITAL_COMMUNITY)
Admission: RE | Admit: 2017-04-05 | Discharge: 2017-04-05 | Disposition: A | Discharge status: Home / Self Care | Payer: PPO | Source: Ambulatory Visit | Attending: Cardiology | Admitting: Cardiology

## 2017-04-05 DIAGNOSIS — E785 Hyperlipidemia, unspecified: Secondary | ICD-10-CM | POA: Insufficient documentation

## 2017-04-05 DIAGNOSIS — I2510 Atherosclerotic heart disease of native coronary artery without angina pectoris: Secondary | ICD-10-CM | POA: Diagnosis not present

## 2017-04-05 DIAGNOSIS — Z87891 Personal history of nicotine dependence: Secondary | ICD-10-CM | POA: Diagnosis not present

## 2017-04-05 DIAGNOSIS — I6523 Occlusion and stenosis of bilateral carotid arteries: Secondary | ICD-10-CM | POA: Diagnosis not present

## 2017-04-05 DIAGNOSIS — I779 Disorder of arteries and arterioles, unspecified: Secondary | ICD-10-CM | POA: Diagnosis not present

## 2017-04-05 DIAGNOSIS — I739 Peripheral vascular disease, unspecified: Secondary | ICD-10-CM

## 2017-04-05 DIAGNOSIS — I10 Essential (primary) hypertension: Secondary | ICD-10-CM | POA: Diagnosis not present

## 2017-04-08 ENCOUNTER — Other Ambulatory Visit: Payer: Self-pay | Admitting: Cardiovascular Disease

## 2017-04-08 DIAGNOSIS — I6521 Occlusion and stenosis of right carotid artery: Secondary | ICD-10-CM

## 2017-04-13 ENCOUNTER — Other Ambulatory Visit: Payer: Self-pay

## 2017-04-13 ENCOUNTER — Ambulatory Visit (HOSPITAL_COMMUNITY): Payer: PPO | Attending: Cardiology

## 2017-04-13 DIAGNOSIS — R29898 Other symptoms and signs involving the musculoskeletal system: Secondary | ICD-10-CM | POA: Diagnosis not present

## 2017-04-13 DIAGNOSIS — I080 Rheumatic disorders of both mitral and aortic valves: Secondary | ICD-10-CM | POA: Diagnosis not present

## 2017-04-13 DIAGNOSIS — I119 Hypertensive heart disease without heart failure: Secondary | ICD-10-CM | POA: Diagnosis not present

## 2017-04-13 DIAGNOSIS — I350 Nonrheumatic aortic (valve) stenosis: Secondary | ICD-10-CM

## 2017-04-14 ENCOUNTER — Other Ambulatory Visit: Payer: Self-pay | Admitting: Cardiovascular Disease

## 2017-04-14 DIAGNOSIS — I350 Nonrheumatic aortic (valve) stenosis: Secondary | ICD-10-CM

## 2017-04-14 DIAGNOSIS — I6521 Occlusion and stenosis of right carotid artery: Secondary | ICD-10-CM

## 2017-04-21 ENCOUNTER — Encounter: Payer: Self-pay | Admitting: Cardiovascular Disease

## 2017-04-21 ENCOUNTER — Ambulatory Visit: Payer: PPO | Admitting: Cardiovascular Disease

## 2017-04-21 DIAGNOSIS — I6521 Occlusion and stenosis of right carotid artery: Secondary | ICD-10-CM | POA: Diagnosis not present

## 2017-04-21 DIAGNOSIS — I447 Left bundle-branch block, unspecified: Secondary | ICD-10-CM

## 2017-04-21 DIAGNOSIS — I350 Nonrheumatic aortic (valve) stenosis: Secondary | ICD-10-CM

## 2017-04-21 DIAGNOSIS — E7800 Pure hypercholesterolemia, unspecified: Secondary | ICD-10-CM

## 2017-04-21 DIAGNOSIS — I10 Essential (primary) hypertension: Secondary | ICD-10-CM | POA: Diagnosis not present

## 2017-04-21 DIAGNOSIS — I2510 Atherosclerotic heart disease of native coronary artery without angina pectoris: Secondary | ICD-10-CM

## 2017-04-21 NOTE — Assessment & Plan Note (Signed)
History of essential hypertension with blood pressure measured 172/76. He is on lisinopril and carvedilol. Continue current meds at current dosing.

## 2017-04-21 NOTE — Assessment & Plan Note (Signed)
History of hyperlipidemia on statin therapy followed by his PCP 

## 2017-04-21 NOTE — Assessment & Plan Note (Signed)
Recent 2-D echo performed 04/13/17 revealed EF of 35-40% with moderate aortic stenosis. The valve area was 1.83 cm with an indexed area of 0.83 cm and a peak gradient of 40 mmHg. We will follow this on an annual basis.

## 2017-04-21 NOTE — Assessment & Plan Note (Signed)
History of CAD status post stenting of his circumflex and LAD by Dr. Michael Boston at [facility] in 2004. I catheterized him 03/02/06 revealing patent stents to his proximal LAD and mid circumflex, 30-40% tapering distal left main and 50-60% AV groove circumflex stenosis proximal to the stent. 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 He denies chest pain or shortness of breath.

## 2017-04-21 NOTE — Progress Notes (Signed)
04/21/2017 Daniel Hanna   [birth year]  [id number]  Primary Physician Rusty Aus, MD Primary Cardiologist: Lorretta Harp MD FACP, [place], [place], [place]  HPI:  Daniel Hanna is a 77 y.o.  mildly overweight married Caucasian male, father of 49 and grandfather to 6 grandchildren, whom I last saw on 03/03/16. He had a history of CAD, status post stenting of his circumflex and LAD by Dr. Michael Boston at [facility] in 2004. His other problems include hypertension and hyperlipidemia. I catheterized him on March 02, 2006, revealing patent stents to his proximal LAD and mid circumflex, 30% to 40% tapered distal left main and 50% to 60% AV groove circumflex prior to the stent as well as a 50% lesion in the LAD in the mid portion. 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 Since I last saw him he really denies chest pain or shortness of breath. Dr. Sabra Heck follows his lipid profile. Apparently he recently had an increase in his PSA and saw a urologist over at [facility] and he recommended a prostate biopsy.  Since I saw him one year ago he has been asymptomatic.Dr. Sabra Heck follows his lipid profile closely. his recent 2-D echo performed 04/13/17 revealed EF of 35-40% with moderate aortic stenosis unchanged from his prior echo.The peak gradient was 40 mmHg.     Current Meds  Medication Sig  . aspirin 81 MG tablet Take 81 mg by mouth daily.  . carvedilol (COREG) 6.25 MG tablet Take 6.25 mg by mouth daily.   . clopidogrel (PLAVIX) 75 MG tablet Take 75 mg by mouth daily.  Marland Kitchen HYDROXYZINE HCL PO Take 25 mg by mouth as needed.   Marland Kitchen lisinopril (PRINIVIL,ZESTRIL) 20 MG tablet Take 1 tablet (20 mg total) by mouth daily.  . pravastatin (PRAVACHOL) 20 MG tablet Take 1 tablet by mouth daily.     Allergies  Allergen Reactions  . Codeine Other (See Comments)   Deriviatives, dysfunctional ,woozy,spinning,dizzy    Social History   Socioeconomic History  . Marital status: Married    Spouse name: Not on file  . Number of children: Not on file  . Years of education: Not on file  . Highest education level: Not on file  Social Needs  . Financial resource strain: Not on file  . Food insecurity - worry: Not on file  . Food insecurity - inability: Not on file  . Transportation needs - medical: Not on file  . Transportation needs - non-medical: Not on file  Occupational History  . Not on file  Tobacco Use  . Smoking status: Former Smoker    Packs/day: 1.00    Years: 25.00    Pack years: 25.00    Last attempt to quit: 05/06/2001    Years since quitting: 15.9  . Smokeless tobacco: Never Used  Substance and Sexual Activity  . Alcohol use: No  . Drug use: No  . Sexual activity: Not on file  Other Topics Concern  . Not on file  Social History Narrative  . Not on file     Review of Systems: General: negative for chills, fever, night sweats or weight changes.  Cardiovascular: negative for chest pain, dyspnea on exertion, edema, orthopnea, palpitations, paroxysmal nocturnal dyspnea or shortness of breath Dermatological: negative for rash Respiratory: negative for cough or wheezing Urologic: negative for hematuria Abdominal:  negative for nausea, vomiting, diarrhea, bright red blood per rectum, melena, or hematemesis Neurologic: negative for visual changes, syncope, or dizziness All other systems reviewed and are otherwise negative except as noted above.    Blood pressure (!) 172/76, pulse 66, height 5\' 11"  (1.803 m), weight 204 lb (92.5 kg).  General appearance: alert and no distress Neck: no adenopathy, no JVD, supple, symmetrical, trachea midline, thyroid not enlarged, symmetric, no tenderness/mass/nodules and soft bruits versus transmitted murmur bilaterally Lungs: clear to auscultation bilaterally Heart: friction rub heard 2/6 outflow  tract murmur consistent with aortic stenosis Extremities: extremities normal, atraumatic, no cyanosis or edema Pulses: 2+ and symmetric Skin: Skin color, texture, turgor normal. No rashes or lesions Neurologic: Alert and oriented X 3, normal strength and tone. Normal symmetric reflexes. Normal coordination and gait  EKG sinus rhythm at 66 with left bundle-branch block. I personally reviewed this EKG.  ASSESSMENT AND PLAN:   Coronary artery disease History of CAD status post stenting of his circumflex and LAD by Dr. Michael Boston at [facility] in 2004. I catheterized him 03/02/06 revealing patent stents to his proximal LAD and mid circumflex, 30-40% tapering distal left main and 50-60% AV groove circumflex stenosis proximal to the stent. 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 He denies chest pain or shortness of breath.  Essential hypertension History of essential hypertension with blood pressure measured 172/76. He is on lisinopril and carvedilol. Continue current meds at current dosing.  Hyperlipidemia History of hyperlipidemia on statin therapy followed by his PCP  Left bundle branch block chronic  Carotid artery disease ([place]) History of carotid artery disease with moderate right ICA stenosis. This will be followed on an annual basis.  Aortic stenosis, moderate Recent 2-D echo performed 04/13/17 revealed EF of 35-40% with moderate aortic stenosis. The valve area was 1.83 cm with an indexed area of 0.83 cm and a peak gradient of 40 mmHg. We will follow this on an annual basis.      Lorretta Harp MD FACP,FACC,FAHA, [facility] 04/21/2017 2:38 PM

## 2017-04-21 NOTE — Assessment & Plan Note (Signed)
History of carotid artery disease with moderate right ICA stenosis. This will be followed on an annual basis.

## 2017-04-21 NOTE — Assessment & Plan Note (Signed)
chronic

## 2017-04-21 NOTE — Patient Instructions (Signed)
Medication Instructions: Your physician recommends that you continue on your current medications as directed. Please refer to the Current Medication list given to you today.   Testing/Procedures: Your physician has requested that you have an echocardiogram--March 2020. Echocardiography is a painless test that uses sound waves to create images of your heart. It provides your doctor with information about the size and shape of your heart and how well your heart's chambers and valves are working. This procedure takes approximately one hour. There are no restrictions for this procedure.  Your physician has requested that you have a carotid duplex--March 2020. This test is an ultrasound of the carotid arteries in your neck. It looks at blood flow through these arteries that supply the brain with blood. Allow one hour for this exam. There are no restrictions or special instructions.  Follow-Up: Your physician wants you to follow-up in: 1 year with Dr. Gwenlyn Found after testing is complete. You will receive a reminder letter in the mail two months in advance. If you don't receive a letter, please call our office to schedule the follow-up appointment.  If you need a refill on your cardiac medications before your next appointment, please call your pharmacy.

## 2017-04-23 NOTE — Addendum Note (Signed)
Addended by: Zebedee Iba on: 04/23/2017 08:47 AM   Modules accepted: Orders

## 2017-05-20 DIAGNOSIS — H60391 Other infective otitis externa, right ear: Secondary | ICD-10-CM | POA: Diagnosis not present

## 2017-05-20 DIAGNOSIS — H6121 Impacted cerumen, right ear: Secondary | ICD-10-CM | POA: Diagnosis not present

## 2017-07-08 DIAGNOSIS — E538 Deficiency of other specified B group vitamins: Secondary | ICD-10-CM | POA: Diagnosis not present

## 2017-07-08 DIAGNOSIS — I2510 Atherosclerotic heart disease of native coronary artery without angina pectoris: Secondary | ICD-10-CM | POA: Diagnosis not present

## 2017-07-08 DIAGNOSIS — E782 Mixed hyperlipidemia: Secondary | ICD-10-CM | POA: Diagnosis not present

## 2017-07-08 DIAGNOSIS — D500 Iron deficiency anemia secondary to blood loss (chronic): Secondary | ICD-10-CM | POA: Diagnosis not present

## 2017-07-08 DIAGNOSIS — Z125 Encounter for screening for malignant neoplasm of prostate: Secondary | ICD-10-CM | POA: Diagnosis not present

## 2017-07-15 DIAGNOSIS — E782 Mixed hyperlipidemia: Secondary | ICD-10-CM | POA: Diagnosis not present

## 2017-07-15 DIAGNOSIS — R739 Hyperglycemia, unspecified: Secondary | ICD-10-CM | POA: Diagnosis not present

## 2017-07-15 DIAGNOSIS — I420 Dilated cardiomyopathy: Secondary | ICD-10-CM | POA: Insufficient documentation

## 2017-07-15 DIAGNOSIS — Z0000 Encounter for general adult medical examination without abnormal findings: Secondary | ICD-10-CM | POA: Diagnosis not present

## 2017-07-15 DIAGNOSIS — E538 Deficiency of other specified B group vitamins: Secondary | ICD-10-CM | POA: Diagnosis not present

## 2017-07-29 DIAGNOSIS — H25813 Combined forms of age-related cataract, bilateral: Secondary | ICD-10-CM | POA: Diagnosis not present

## 2017-08-25 DIAGNOSIS — Z859 Personal history of malignant neoplasm, unspecified: Secondary | ICD-10-CM | POA: Diagnosis not present

## 2017-08-25 DIAGNOSIS — C4441 Basal cell carcinoma of skin of scalp and neck: Secondary | ICD-10-CM | POA: Diagnosis not present

## 2017-08-25 DIAGNOSIS — Z872 Personal history of diseases of the skin and subcutaneous tissue: Secondary | ICD-10-CM | POA: Diagnosis not present

## 2017-08-25 DIAGNOSIS — L578 Other skin changes due to chronic exposure to nonionizing radiation: Secondary | ICD-10-CM | POA: Diagnosis not present

## 2017-08-25 DIAGNOSIS — Z85828 Personal history of other malignant neoplasm of skin: Secondary | ICD-10-CM | POA: Diagnosis not present

## 2017-08-25 DIAGNOSIS — L570 Actinic keratosis: Secondary | ICD-10-CM | POA: Diagnosis not present

## 2017-08-25 DIAGNOSIS — Z86018 Personal history of other benign neoplasm: Secondary | ICD-10-CM | POA: Diagnosis not present

## 2017-08-25 DIAGNOSIS — Z1283 Encounter for screening for malignant neoplasm of skin: Secondary | ICD-10-CM | POA: Diagnosis not present

## 2017-08-25 DIAGNOSIS — D485 Neoplasm of uncertain behavior of skin: Secondary | ICD-10-CM | POA: Diagnosis not present

## 2017-09-15 DIAGNOSIS — C4441 Basal cell carcinoma of skin of scalp and neck: Secondary | ICD-10-CM | POA: Diagnosis not present

## 2017-09-15 DIAGNOSIS — C4491 Basal cell carcinoma of skin, unspecified: Secondary | ICD-10-CM | POA: Diagnosis not present

## 2017-10-21 DIAGNOSIS — M1711 Unilateral primary osteoarthritis, right knee: Secondary | ICD-10-CM | POA: Insufficient documentation

## 2017-10-21 DIAGNOSIS — M25561 Pain in right knee: Secondary | ICD-10-CM | POA: Diagnosis not present

## 2017-11-16 ENCOUNTER — Emergency Department
Admission: EM | Admit: 2017-11-16 | Discharge: 2017-11-16 | Disposition: A | Discharge status: Home / Self Care | Payer: PPO | Attending: Emergency Medicine | Admitting: Emergency Medicine

## 2017-11-16 ENCOUNTER — Other Ambulatory Visit: Payer: Self-pay

## 2017-11-16 ENCOUNTER — Emergency Department: Payer: PPO

## 2017-11-16 ENCOUNTER — Encounter: Payer: Self-pay | Admitting: Emergency Medicine

## 2017-11-16 DIAGNOSIS — Z79899 Other long term (current) drug therapy: Secondary | ICD-10-CM | POA: Insufficient documentation

## 2017-11-16 DIAGNOSIS — Z7902 Long term (current) use of antithrombotics/antiplatelets: Secondary | ICD-10-CM | POA: Diagnosis not present

## 2017-11-16 DIAGNOSIS — W19XXXA Unspecified fall, initial encounter: Secondary | ICD-10-CM

## 2017-11-16 DIAGNOSIS — Y999 Unspecified external cause status: Secondary | ICD-10-CM | POA: Diagnosis not present

## 2017-11-16 DIAGNOSIS — Z87891 Personal history of nicotine dependence: Secondary | ICD-10-CM | POA: Diagnosis not present

## 2017-11-16 DIAGNOSIS — Y92821 Forest as the place of occurrence of the external cause: Secondary | ICD-10-CM | POA: Diagnosis not present

## 2017-11-16 DIAGNOSIS — I2510 Atherosclerotic heart disease of native coronary artery without angina pectoris: Secondary | ICD-10-CM | POA: Diagnosis not present

## 2017-11-16 DIAGNOSIS — S060X1A Concussion with loss of consciousness of 30 minutes or less, initial encounter: Secondary | ICD-10-CM | POA: Insufficient documentation

## 2017-11-16 DIAGNOSIS — Z23 Encounter for immunization: Secondary | ICD-10-CM | POA: Insufficient documentation

## 2017-11-16 DIAGNOSIS — S060X0A Concussion without loss of consciousness, initial encounter: Secondary | ICD-10-CM | POA: Diagnosis not present

## 2017-11-16 DIAGNOSIS — S069X9A Unspecified intracranial injury with loss of consciousness of unspecified duration, initial encounter: Secondary | ICD-10-CM | POA: Diagnosis not present

## 2017-11-16 DIAGNOSIS — Y9389 Activity, other specified: Secondary | ICD-10-CM | POA: Diagnosis not present

## 2017-11-16 DIAGNOSIS — S40812A Abrasion of left upper arm, initial encounter: Secondary | ICD-10-CM | POA: Insufficient documentation

## 2017-11-16 DIAGNOSIS — S61411A Laceration without foreign body of right hand, initial encounter: Secondary | ICD-10-CM | POA: Insufficient documentation

## 2017-11-16 DIAGNOSIS — S199XXA Unspecified injury of neck, initial encounter: Secondary | ICD-10-CM | POA: Diagnosis not present

## 2017-11-16 DIAGNOSIS — Z7982 Long term (current) use of aspirin: Secondary | ICD-10-CM | POA: Insufficient documentation

## 2017-11-16 DIAGNOSIS — W11XXXA Fall on and from ladder, initial encounter: Secondary | ICD-10-CM | POA: Diagnosis not present

## 2017-11-16 DIAGNOSIS — I10 Essential (primary) hypertension: Secondary | ICD-10-CM | POA: Diagnosis not present

## 2017-11-16 DIAGNOSIS — S60512A Abrasion of left hand, initial encounter: Secondary | ICD-10-CM | POA: Diagnosis not present

## 2017-11-16 DIAGNOSIS — S40811A Abrasion of right upper arm, initial encounter: Secondary | ICD-10-CM | POA: Diagnosis not present

## 2017-11-16 DIAGNOSIS — S0990XA Unspecified injury of head, initial encounter: Secondary | ICD-10-CM | POA: Diagnosis present

## 2017-11-16 DIAGNOSIS — T148XXA Other injury of unspecified body region, initial encounter: Secondary | ICD-10-CM

## 2017-11-16 DIAGNOSIS — S60511A Abrasion of right hand, initial encounter: Secondary | ICD-10-CM | POA: Diagnosis not present

## 2017-11-16 DIAGNOSIS — S060X9A Concussion with loss of consciousness of unspecified duration, initial encounter: Secondary | ICD-10-CM

## 2017-11-16 MED ORDER — TETANUS-DIPHTH-ACELL PERTUSSIS 5-2.5-18.5 LF-MCG/0.5 IM SUSP
INTRAMUSCULAR | Status: AC
  Administered 2017-11-16: 0.5 mL via INTRAMUSCULAR
  Filled 2017-11-16: qty 0.5

## 2017-11-16 MED ORDER — TETANUS-DIPHTH-ACELL PERTUSSIS 5-2.5-18.5 LF-MCG/0.5 IM SUSP
0.5000 mL | Freq: Once | INTRAMUSCULAR | Status: AC
  Administered 2017-11-16: 0.5 mL via INTRAMUSCULAR

## 2017-11-16 NOTE — ED Provider Notes (Signed)
[facility] Emergency Department Provider Note  ___________________________________________   First MD Initiated Contact with Patient 11/16/17 1532     (approximate)  I have reviewed the triage vital signs and the nursing notes.   HISTORY  Chief Complaint Fall   HPI Daniel Hanna is a 77 y.o. male with a history of CAD on Plavix was presenting after a fall.  He says that he was out deer hunting and fell off a ladder of approximately 6 feet tall.  The patient first fell into his friend who broke his fall but then continued to fall, hitting the back of his head on the ground.  The friends that he was very confused for approximately 30 minutes.  No obvious loss of consciousness.  Patient denies any headache or neck pain at this time.  Has multiple abrasions to his bilateral upper extremities as well as a laceration to the right thenar eminence.  Patient denies any numbness or weakness to his thumb. Does not know the date of his last tetanus shot.  Says there is no foreign body sensation to the right thenar eminence.  Patient states that the initial fall was caused by a ladder twisting and "throwing the off."  Past Medical History:  Diagnosis Date  . Arthritis    fingers  . Coronary artery disease CARDIOLOGIST--   DR BERRY--  LOV NOTE 01-29-2012  W/ CHART  . ED (erectile dysfunction)   . History of coronary artery stent placement    STENTS X2  TO LAD AND CIRCUMFLEX  . History of elevated PSA 2003   PSA=0.95  . History of myocardial infarction DEC 2004   S/P STENT X2  AT [facility]  . Hypercholesterolemia   . Hypertension   . LBBB (left bundle branch block)   . Prostate cancer ([place]) 02/24/12   BX=Adenocarcinoma,Gleason 3+3=6,3+4=7 PSA=5.80    Patient Active Problem List   Diagnosis Date Noted  . Aortic stenosis, moderate 04/21/2017  . Carotid artery disease ([place]) 01/16/2015  . Coronary artery disease 01/11/2013  . Essential hypertension 01/11/2013  .  Hyperlipidemia 01/11/2013  . Left bundle branch block 01/11/2013  . History of elevated PSA   . Prostate cancer ([place]) 02/24/2012    Past Surgical History:  Procedure Laterality Date  . CARDIAC CATHETERIZATION  03-02-2006  DR BERRY   PATENT STENTS/ 30-40% TAPERED DISTAL LEFT MAIN AND 50-60% AV GROOVE CIRCUMFLEX PRIOR TO STENT  &  50% LESION IN MID PORTION OF LAD.  Marland Kitchen CARDIOVASCULAR STRESS TEST  05-30-2010   DR BERRY   LOW RISK SCAN/  MODERATE SIZED INEROLATERAL SCAR WITHOUT ISCHEMIA / ISCHEMIA/ NO SIGNIFICANT CHANGE FROM PREVIOUS STUDY/   . COLONOSCOPY WITH PROPOFOL N/A 09/23/2015   Procedure: COLONOSCOPY WITH PROPOFOL;  Surgeon: Manya Silvas, MD;  Location: [facility] ENDOSCOPY;  Service: Endoscopy;  Laterality: N/A;  . CORONARY ANGIOPLASTY WITH STENT PLACEMENT  DEC 2004  AT [facility]   STENTING OF CIRCUMFLEX AND LAD  . ESOPHAGOGASTRODUODENOSCOPY (EGD) WITH PROPOFOL N/A 09/23/2015   Procedure: ESOPHAGOGASTRODUODENOSCOPY (EGD) WITH PROPOFOL;  Surgeon: Manya Silvas, MD;  Location: [facility] ENDOSCOPY;  Service: Endoscopy;  Laterality: N/A;  . HEMORRHOID SURGERY    . INGUINAL HERNIA REPAIR  1980'S  . PROSTATE BIOPSY  02/29/12   ADENOCARCINOMA  . RADIOACTIVE SEED IMPLANT N/A 05/12/2012   Procedure: RADIOACTIVE SEED IMPLANT;  Surgeon: Franchot Gallo, MD;  Location: [facility];  Service: Urology;  Laterality: N/A;  DR PORTABLE/RAD TECH NEEDED  . TRANSTHORACIC ECHOCARDIOGRAM  05-30-2010   MILD CONCENTRIC LVH/ EF 97%/ STAGE I DIASTOLIC DYSFUCTION/ MILD MR, TR, & AR/ MILD SCLEROTIC AORTIC VALVE WITHOUT STENOSIS    Prior to Admission medications   Medication Sig Start Date End Date Taking? Authorizing Provider  aspirin 81 MG tablet Take 81 mg by mouth daily.    [provider]  carvedilol (COREG) 6.25 MG tablet Take 6.25 mg by mouth daily.     [provider]  clopidogrel (PLAVIX) 75 MG tablet Take 75 mg by mouth daily.    [provider]  HYDROXYZINE HCL PO  Take 25 mg by mouth as needed.     [provider]  lisinopril (PRINIVIL,ZESTRIL) 20 MG tablet Take 1 tablet (20 mg total) by mouth daily. 03/03/16   Lorretta Harp, MD  pravastatin (PRAVACHOL) 20 MG tablet Take 1 tablet by mouth daily. 06/26/15   [provider]    Allergies Codeine  Family History  Problem Relation Age of Onset  . Diabetes Mother   . Stroke Mother   . Cancer Sister 24       unknown    Social History Social History   Tobacco Use  . Smoking status: Former Smoker    Packs/day: 1.00    Years: 25.00    Pack years: 25.00    Last attempt to quit: 05/06/2001    Years since quitting: 16.5  . Smokeless tobacco: Never Used  Substance Use Topics  . Alcohol use: No  . Drug use: No    Review of Systems  Constitutional: No fever/chills Eyes: No visual changes. ENT: No sore throat. Cardiovascular: Denies chest pain. Respiratory: Denies shortness of breath. Gastrointestinal: No abdominal pain.  No nausea, no vomiting.  No diarrhea.  No constipation. Genitourinary: Negative for dysuria. Musculoskeletal: Negative for back pain. Skin: Negative for rash. Neurological: Negative for headaches, focal weakness or numbness.   ____________________________________________   PHYSICAL EXAM:  VITAL SIGNS: ED Triage Vitals  Enc Vitals Group     BP 11/16/17 1312 (!) 166/63     Pulse Rate 11/16/17 1312 (!) 56     Resp 11/16/17 1312 16     Temp 11/16/17 1312 98.3 F (36.8 C)     Temp Source 11/16/17 1312 Oral     SpO2 11/16/17 1312 98 %     Weight 11/16/17 1313 190 lb (86.2 kg)     Height 11/16/17 1313 5\' 11"  (1.803 m)     Head Circumference --      Peak Flow --      Pain Score 11/16/17 1313 0     Pain Loc --      Pain Edu? --      Excl. in [place]? --     Constitutional: Alert and oriented. Well appearing and in no acute distress. Eyes: Conjunctivae are normal.  Head: Atraumatic. Nose: No congestion/rhinnorhea. Mouth/Throat: Mucous membranes are  moist.  Neck: No stridor.  No tenderness to the cervical spine.  No deformity or step-off.   Cardiovascular: Normal rate, regular rhythm. Grossly normal heart sounds.   Respiratory: Normal respiratory effort.  No retractions. Lungs CTAB. Gastrointestinal: Soft and nontender. No distention. No CVA tenderness. Musculoskeletal: No lower extremity tenderness nor edema.  No joint effusions.  No tenderness to the thoracic or lumbar spine.  No deformity or step-off.  Multiple superficial abrasions to the bilateral upper extremities without active bleeding.  There is a 3 cm curvilinear laceration to the right thenar eminence without active bleeding at this time.  Appears 3 to 4 mm deep.  No tenderness to palpation of the bony structures of the hand.  No pain to axial load of the right thumb.  No tenderness to the snuffbox.  Full range of motion of the thumb as well as the wrist and other fingers.  Neurovascularly intact.  Neurologic:  Normal speech and language. No gross focal neurologic deficits are appreciated. Skin:  Skin is warm, dry and intact. No rash noted. Psychiatric: Mood and affect are normal. Speech and behavior are normal.  ____________________________________________   LABS (all labs ordered are listed, but only abnormal results are displayed)  Labs Reviewed - No data to display ____________________________________________  EKG  ED ECG REPORT I, Doran Stabler, the attending physician, personally viewed and interpreted this ECG.   Date: 11/16/2017  EKG Time: 1316  Rate: 61  Rhythm: normal sinus rhythm  Axis: Normal  Intervals:left bundle branch block  ST&T Change: ST segment elevation and depression consistent with left bundle branch block.  T wave inversions in 1, 2 as well as aVL, 3 and aVF.  Also with T wave inversion in V5 and V6. No significant change from  previous. ____________________________________________  [id number]   ____________________________________________   PROCEDURES  Procedure(s) performed:    Marland KitchenMarland KitchenLaceration Repair Date/Time: 11/16/2017 4:03 PM Performed by: Orbie Pyo, MD Authorized by: Orbie Pyo, MD   Consent:    Consent obtained:  Verbal   Consent given by:  Patient   Risks discussed:  Infection, pain, retained foreign body, poor cosmetic result and poor wound healing Laceration details:    Location:  Hand   Hand location:  R palm   Length (cm):  3   Depth (mm):  4 Repair type:    Repair type:  Simple Exploration:    Hemostasis achieved with:  Direct pressure   Wound exploration: entire depth of wound probed and visualized     Contaminated: no   Treatment:    Area cleansed with:  Saline   Amount of cleaning:  Extensive   Irrigation solution:  Sterile saline   Irrigation method:  Syringe   Visualized foreign bodies/material removed: no   Skin repair:    Repair method:  Steri-Strips and tissue adhesive Approximation:    Approximation:  Close Post-procedure details:    Dressing:  Sterile dressing   Patient tolerance of procedure:  Tolerated well, no immediate complications    Critical Care performed:   ____________________________________________   INITIAL IMPRESSION / ASSESSMENT AND PLAN / ED COURSE  Pertinent labs & imaging results that were available during my care of the patient were reviewed by me and considered in my medical decision making (see chart for details).  DDX: Intrarenal hemorrhage, skull fracture, cervical spine fracture, multiple abrasions, laceration, wrist fracture, thumb fracture, concussion As part of my medical decision making, I reviewed the following data within the [facility] Notes from prior ED visits  ----------------------------------------- 4:04 PM on 11/16/2017 -----------------------------------------  Wound on  the thumb closed with Dermabond as well as Steri-Strips.  Patient neurovascular intact tolerated well.  No bleeding at this time.  No headache or neck pain the patient with concussive symptoms initially.  Patient will be discharged at this time.  He is understanding the diagnosis as well as treatment and willing to comply.  We will update his tetanus shot prior to discharge.  Mechanical fall. ____________________________________________   FINAL CLINICAL IMPRESSION(S) / ED DIAGNOSES  Mechanical fall.  Multiple abrasions.  Concussion.  Hand laceration.  NEW MEDICATIONS STARTED DURING THIS VISIT:  New Prescriptions   No medications on file     Note:  This document was prepared using Dragon voice recognition software and may include unintentional dictation errors.     Orbie Pyo, MD 11/16/17 [phone]

## 2017-11-16 NOTE — ED Notes (Signed)
Spoke with MD Cinda Quest about pt presentation, see orders

## 2017-11-16 NOTE — ED Notes (Signed)
c collar applied at this time.

## 2017-11-16 NOTE — ED Notes (Signed)
Pt family member came up to desk stating that pt R hand began bleeding around bandaid. bandaid removed and gauze placed on cut. Wrapped hand with gauze. Will continue to monitor.

## 2017-11-16 NOTE — ED Triage Notes (Addendum)
Pt to ED via POV has mechanical fall off of ladder aprox 54ft onto ground outside. PT + LOC, witness states pt was out for only a few seconds.  + head injury, pt takes plavix.  A&OIX4 at this time, VSS. Denies pain

## 2017-11-18 DIAGNOSIS — Z23 Encounter for immunization: Secondary | ICD-10-CM | POA: Diagnosis not present

## 2017-11-18 DIAGNOSIS — W11XXXA Fall on and from ladder, initial encounter: Secondary | ICD-10-CM | POA: Diagnosis not present

## 2017-11-18 DIAGNOSIS — S61011A Laceration without foreign body of right thumb without damage to nail, initial encounter: Secondary | ICD-10-CM | POA: Diagnosis not present

## 2017-11-18 DIAGNOSIS — R4020 Unspecified coma: Secondary | ICD-10-CM | POA: Diagnosis not present

## 2018-01-07 DIAGNOSIS — R739 Hyperglycemia, unspecified: Secondary | ICD-10-CM | POA: Diagnosis not present

## 2018-01-07 DIAGNOSIS — E538 Deficiency of other specified B group vitamins: Secondary | ICD-10-CM | POA: Diagnosis not present

## 2018-01-07 DIAGNOSIS — E782 Mixed hyperlipidemia: Secondary | ICD-10-CM | POA: Diagnosis not present

## 2018-01-13 DIAGNOSIS — Z125 Encounter for screening for malignant neoplasm of prostate: Secondary | ICD-10-CM | POA: Diagnosis not present

## 2018-01-13 DIAGNOSIS — J90 Pleural effusion, not elsewhere classified: Secondary | ICD-10-CM | POA: Diagnosis not present

## 2018-01-13 DIAGNOSIS — I350 Nonrheumatic aortic (valve) stenosis: Secondary | ICD-10-CM | POA: Diagnosis not present

## 2018-01-13 DIAGNOSIS — J61 Pneumoconiosis due to asbestos and other mineral fibers: Secondary | ICD-10-CM | POA: Diagnosis not present

## 2018-01-13 DIAGNOSIS — M7050 Other bursitis of knee, unspecified knee: Secondary | ICD-10-CM | POA: Diagnosis not present

## 2018-01-13 DIAGNOSIS — I420 Dilated cardiomyopathy: Secondary | ICD-10-CM | POA: Diagnosis not present

## 2018-01-13 DIAGNOSIS — E782 Mixed hyperlipidemia: Secondary | ICD-10-CM | POA: Diagnosis not present

## 2018-01-13 DIAGNOSIS — E538 Deficiency of other specified B group vitamins: Secondary | ICD-10-CM | POA: Diagnosis not present

## 2018-03-11 DIAGNOSIS — W57XXXA Bitten or stung by nonvenomous insect and other nonvenomous arthropods, initial encounter: Secondary | ICD-10-CM | POA: Diagnosis not present

## 2018-03-11 DIAGNOSIS — L03211 Cellulitis of face: Secondary | ICD-10-CM | POA: Diagnosis not present

## 2018-03-25 DIAGNOSIS — L720 Epidermal cyst: Secondary | ICD-10-CM | POA: Diagnosis not present

## 2018-03-25 DIAGNOSIS — D485 Neoplasm of uncertain behavior of skin: Secondary | ICD-10-CM | POA: Diagnosis not present

## 2018-03-31 DIAGNOSIS — H43811 Vitreous degeneration, right eye: Secondary | ICD-10-CM | POA: Diagnosis not present

## 2018-04-06 ENCOUNTER — Ambulatory Visit (HOSPITAL_COMMUNITY)
Admission: RE | Admit: 2018-04-06 | Payer: PPO | Source: Ambulatory Visit | Attending: Cardiovascular Disease | Admitting: Cardiovascular Disease

## 2018-06-13 DIAGNOSIS — H6122 Impacted cerumen, left ear: Secondary | ICD-10-CM | POA: Diagnosis not present

## 2018-06-13 DIAGNOSIS — H60392 Other infective otitis externa, left ear: Secondary | ICD-10-CM | POA: Diagnosis not present

## 2018-07-20 DIAGNOSIS — E782 Mixed hyperlipidemia: Secondary | ICD-10-CM | POA: Diagnosis not present

## 2018-07-20 DIAGNOSIS — Z125 Encounter for screening for malignant neoplasm of prostate: Secondary | ICD-10-CM | POA: Diagnosis not present

## 2018-07-20 DIAGNOSIS — I420 Dilated cardiomyopathy: Secondary | ICD-10-CM | POA: Diagnosis not present

## 2018-07-20 DIAGNOSIS — E538 Deficiency of other specified B group vitamins: Secondary | ICD-10-CM | POA: Diagnosis not present

## 2018-07-25 DIAGNOSIS — H60392 Other infective otitis externa, left ear: Secondary | ICD-10-CM | POA: Diagnosis not present

## 2018-07-25 DIAGNOSIS — H6122 Impacted cerumen, left ear: Secondary | ICD-10-CM | POA: Diagnosis not present

## 2018-07-25 DIAGNOSIS — H6060 Unspecified chronic otitis externa, unspecified ear: Secondary | ICD-10-CM | POA: Diagnosis not present

## 2018-07-27 DIAGNOSIS — I350 Nonrheumatic aortic (valve) stenosis: Secondary | ICD-10-CM | POA: Diagnosis not present

## 2018-07-27 DIAGNOSIS — I6523 Occlusion and stenosis of bilateral carotid arteries: Secondary | ICD-10-CM | POA: Diagnosis not present

## 2018-07-27 DIAGNOSIS — Z0000 Encounter for general adult medical examination without abnormal findings: Secondary | ICD-10-CM | POA: Diagnosis not present

## 2018-07-27 DIAGNOSIS — E538 Deficiency of other specified B group vitamins: Secondary | ICD-10-CM | POA: Diagnosis not present

## 2018-07-27 DIAGNOSIS — E782 Mixed hyperlipidemia: Secondary | ICD-10-CM | POA: Diagnosis not present

## 2018-07-27 DIAGNOSIS — J61 Pneumoconiosis due to asbestos and other mineral fibers: Secondary | ICD-10-CM | POA: Diagnosis not present

## 2018-07-27 DIAGNOSIS — D369 Benign neoplasm, unspecified site: Secondary | ICD-10-CM | POA: Diagnosis not present

## 2018-07-27 DIAGNOSIS — I420 Dilated cardiomyopathy: Secondary | ICD-10-CM | POA: Diagnosis not present

## 2018-08-08 DIAGNOSIS — I6523 Occlusion and stenosis of bilateral carotid arteries: Secondary | ICD-10-CM | POA: Diagnosis not present

## 2018-08-08 DIAGNOSIS — I350 Nonrheumatic aortic (valve) stenosis: Secondary | ICD-10-CM | POA: Diagnosis not present

## 2018-08-29 ENCOUNTER — Encounter (INDEPENDENT_AMBULATORY_CARE_PROVIDER_SITE_OTHER): Payer: Self-pay

## 2018-08-29 ENCOUNTER — Encounter (INDEPENDENT_AMBULATORY_CARE_PROVIDER_SITE_OTHER): Payer: Self-pay | Admitting: Vascular Surgery

## 2018-08-29 ENCOUNTER — Ambulatory Visit (INDEPENDENT_AMBULATORY_CARE_PROVIDER_SITE_OTHER): Payer: PPO | Admitting: Vascular Surgery

## 2018-08-29 ENCOUNTER — Other Ambulatory Visit: Payer: Self-pay

## 2018-08-29 VITALS — BP 166/78 | HR 60 | Resp 17 | Ht 71.0 in | Wt 201.0 lb

## 2018-08-29 DIAGNOSIS — I2510 Atherosclerotic heart disease of native coronary artery without angina pectoris: Secondary | ICD-10-CM | POA: Diagnosis not present

## 2018-08-29 DIAGNOSIS — I10 Essential (primary) hypertension: Secondary | ICD-10-CM

## 2018-08-29 DIAGNOSIS — I6521 Occlusion and stenosis of right carotid artery: Secondary | ICD-10-CM | POA: Diagnosis not present

## 2018-08-29 DIAGNOSIS — E7800 Pure hypercholesterolemia, unspecified: Secondary | ICD-10-CM | POA: Diagnosis not present

## 2018-08-29 NOTE — Progress Notes (Signed)
MRN : [id number]  Daniel Hanna is a 78 y.o. (1941-01-30) male who presents with chief complaint of  Chief Complaint  Patient presents with  . New Patient (Initial Visit)    Carotid artery occlusion  .  History of Present Illness:   The patient is seen for evaluation of carotid stenosis. The carotid stenosis was identified after a duplex ultrasound done on 08/08/2018.  The patient denies amaurosis fugax. There is no recent history of TIA symptoms or focal motor deficits. There is no prior documented CVA.  There is no history of migraine headaches. There is no history of seizures.  The patient is taking enteric-coated aspirin 81 mg daily.  The patient has a history of coronary artery disease, no recent episodes of angina or shortness of breath. The patient denies PAD or claudication symptoms. There is a history of hyperlipidemia which is being treated with a statin.    Current Meds  Medication Sig  . amLODipine (NORVASC) 2.5 MG tablet TAKE 1 TABLET BY MOUTH ONCE DAILY  . aspirin 81 MG tablet Take 81 mg by mouth daily.  . carvedilol (COREG) 6.25 MG tablet Take 6.25 mg by mouth daily.   . clopidogrel (PLAVIX) 75 MG tablet Take 75 mg by mouth daily.  . clotrimazole-betamethasone (LOTRISONE) cream   . HYDROXYZINE HCL PO Take 25 mg by mouth as needed.   . Iron-Vitamin C (VITRON-C) 65-125 MG TABS Take by mouth.  Marland Kitchen lisinopril (PRINIVIL,ZESTRIL) 20 MG tablet Take 1 tablet (20 mg total) by mouth daily.  . pravastatin (PRAVACHOL) 20 MG tablet Take 1 tablet by mouth daily.    Past Medical History:  Diagnosis Date  . Arthritis    fingers  . Coronary artery disease CARDIOLOGIST--   DR BERRY--  LOV NOTE 01-29-2012  W/ CHART  . ED (erectile dysfunction)   . History of coronary artery stent placement    STENTS X2  TO LAD AND CIRCUMFLEX  . History of elevated PSA 2003   PSA=0.95  . History of myocardial infarction DEC 2004   S/P STENT X2  AT [facility]  . Hypercholesterolemia   .  Hypertension   . LBBB (left bundle branch block)   . Prostate cancer ([place]) 02/24/12   BX=Adenocarcinoma,Gleason 3+3=6,3+4=7 PSA=5.80    Past Surgical History:  Procedure Laterality Date  . CARDIAC CATHETERIZATION  03-02-2006  DR BERRY   PATENT STENTS/ 30-40% TAPERED DISTAL LEFT MAIN AND 50-60% AV GROOVE CIRCUMFLEX PRIOR TO STENT  &  50% LESION IN MID PORTION OF LAD.  Marland Kitchen CARDIOVASCULAR STRESS TEST  05-30-2010   DR BERRY   LOW RISK SCAN/  MODERATE SIZED INEROLATERAL SCAR WITHOUT ISCHEMIA / ISCHEMIA/ NO SIGNIFICANT CHANGE FROM PREVIOUS STUDY/   . COLONOSCOPY WITH PROPOFOL N/A 09/23/2015   Procedure: COLONOSCOPY WITH PROPOFOL;  Surgeon: Manya Silvas, MD;  Location: [facility] ENDOSCOPY;  Service: Endoscopy;  Laterality: N/A;  . CORONARY ANGIOPLASTY WITH STENT PLACEMENT  DEC 2004  AT [facility]   STENTING OF CIRCUMFLEX AND LAD  . ESOPHAGOGASTRODUODENOSCOPY (EGD) WITH PROPOFOL N/A 09/23/2015   Procedure: ESOPHAGOGASTRODUODENOSCOPY (EGD) WITH PROPOFOL;  Surgeon: Manya Silvas, MD;  Location: [facility] ENDOSCOPY;  Service: Endoscopy;  Laterality: N/A;  . HEMORRHOID SURGERY    . INGUINAL HERNIA REPAIR  1980'S  . PROSTATE BIOPSY  02/29/12   ADENOCARCINOMA  . RADIOACTIVE SEED IMPLANT N/A 05/12/2012   Procedure: RADIOACTIVE SEED IMPLANT;  Surgeon: Franchot Gallo, MD;  Location: [facility];  Service: Urology;  Laterality: N/A;  DR  PORTABLE/RAD TECH NEEDED  . TRANSTHORACIC ECHOCARDIOGRAM  05-30-2010   MILD CONCENTRIC LVH/ EF 25%/ STAGE I DIASTOLIC DYSFUCTION/ MILD MR, TR, & AR/ MILD SCLEROTIC AORTIC VALVE WITHOUT STENOSIS    Social History Social History   Tobacco Use  . Smoking status: Former Smoker    Packs/day: 1.00    Years: 25.00    Pack years: 25.00    Quit date: 05/06/2001    Years since quitting: 17.3  . Smokeless tobacco: Never Used  Substance Use Topics  . Alcohol use: No  . Drug use: No    Family History Family History  Problem Relation Age of Onset  . Diabetes Mother    . Stroke Mother   . Cancer Sister 62       unknown  No family history of bleeding/clotting disorders, porphyria or autoimmune disease    Allergies  Allergen Reactions  . Codeine Other (See Comments)    Deriviatives, dysfunctional ,woozy,spinning,dizzy     REVIEW OF SYSTEMS (Negative unless checked)  Constitutional: [] Weight loss  [] Fever  [] Chills Cardiac: [] Chest pain   [] Chest pressure   [] Palpitations   [] Shortness of breath when laying flat   [] Shortness of breath with exertion. Vascular:  [] Pain in legs with walking   [] Pain in legs at rest  [] History of DVT   [] Phlebitis   [] Swelling in legs   [] Varicose veins   [] Non-healing ulcers Pulmonary:   [] Uses home oxygen   [] Productive cough   [] Hemoptysis   [] Wheeze  [] COPD   [] Asthma Neurologic:  [] Dizziness   [] Seizures   [] History of stroke   [] History of TIA  [] Aphasia   [] Vissual changes   [] Weakness or numbness in arm   [] Weakness or numbness in leg Musculoskeletal:   [] Joint swelling   [] Joint pain   [] Low back pain Hematologic:  [] Easy bruising  [] Easy bleeding   [] Hypercoagulable state   [] Anemic Gastrointestinal:  [] Diarrhea   [] Vomiting  [] Gastroesophageal reflux/heartburn   [] Difficulty swallowing. Genitourinary:  [] Chronic kidney disease   [] Difficult urination  [] Frequent urination   [] Blood in urine Skin:  [] Rashes   [] Ulcers  Psychological:  [] History of anxiety   []  History of major depression.  Physical Examination  Vitals:   08/29/18 1112  BP: (!) 166/78  Pulse: 60  Resp: 17  Weight: 201 lb (91.2 kg)  Height: 5\' 11"  (1.803 m)   Body mass index is 28.03 kg/m. Gen: WD/WN, NAD Head: [place]/AT, No temporalis wasting.  Ear/Nose/Throat: Hearing grossly intact, nares w/o erythema or drainage, poor dentition Eyes: PER, EOMI, sclera nonicteric.  Neck: Supple, no masses.  No bruit or JVD.  Pulmonary:  Good air movement, clear to auscultation bilaterally, no use of accessory muscles.  Cardiac: RRR, normal S1, S2,  +systolic ejection murmur. Vascular: bilateral carotid bruits Vessel Right Left  Radial Palpable Palpable  Ulnar Palpable Palpable  Brachial Palpable Palpable  Carotid Palpable Palpable  Gastrointestinal: soft, non-distended. No guarding/no peritoneal signs.  Musculoskeletal: M/S 5/5 throughout.  No deformity or atrophy.  Neurologic: CN 2-12 intact. Pain and light touch intact in extremities.  Symmetrical.  Speech is fluent. Motor exam as listed above. Psychiatric: Judgment intact, Mood & affect appropriate for pt's clinical situation. Dermatologic: No rashes or ulcers noted.  No changes consistent with cellulitis. Lymph : No Cervical lymphadenopathy, no lichenification or skin changes of chronic lymphedema.  CBC Lab Results  Component Value Date   WBC 5.2 05/05/2012   HGB 14.6 05/05/2012   HCT 41.5 05/05/2012   MCV 90.4  05/05/2012   PLT 155 05/05/2012    BMET    Component Value Date/Time   NA 141 03/24/2016 0809   K 3.9 03/24/2016 0809   CL 101 03/24/2016 0809   CO2 22 03/24/2016 0809   GLUCOSE 117 (H) 03/24/2016 0809   GLUCOSE 118 (H) 05/05/2012 0808   BUN 12 03/24/2016 0809   CREATININE 0.93 03/24/2016 0809   CALCIUM 9.4 03/24/2016 0809   GFRNONAA 79 03/24/2016 0809   GFRAA 91 03/24/2016 0809   CrCl cannot be calculated (Patient's most recent lab result is older than the maximum 21 days allowed.).  COAG Lab Results  Component Value Date   INR 1.05 05/05/2012    Radiology No results found.    Assessment/Plan 1. Stenosis of right carotid artery The patient remains asymptomatic with respect to the carotid stenosis.  However, the patient has now progressed and has a lesion the is >70%.  Patient should undergo CT angiography of the carotid arteries to define the degree of stenosis of the internal carotid arteries bilaterally and the anatomic suitability for surgery vs. intervention.  If the patient does indeed need surgery cardiac clearance will be required,  once cleared the patient will be scheduled for surgery.  The risks, benefits and alternative therapies were reviewed in detail with the patient.  All questions were answered.  The patient agrees to proceed with imaging.  Continue antiplatelet therapy as prescribed. Continue management of CAD, HTN and Hyperlipidemia. Healthy heart diet, encouraged exercise at least 4 times per week.   - CT ANGIO NECK W OR WO CONTRAST; Future  2. ASCVD (arteriosclerotic cardiovascular disease) Continue cardiac and antihypertensive medications as already ordered and reviewed, no changes at this time.  Continue statin as ordered and reviewed, no changes at this time  Nitrates PRN for chest pain   3. Essential hypertension Continue antihypertensive medications as already ordered, these medications have been reviewed and there are no changes at this time.   4. Pure hypercholesterolemia Continue statin as ordered and reviewed, no changes at this time    Hortencia Pilar, MD  08/29/2018 11:30 AM

## 2018-09-02 ENCOUNTER — Telehealth (INDEPENDENT_AMBULATORY_CARE_PROVIDER_SITE_OTHER): Payer: Self-pay

## 2018-09-02 ENCOUNTER — Telehealth (INDEPENDENT_AMBULATORY_CARE_PROVIDER_SITE_OTHER): Payer: Self-pay | Admitting: Nurse Practitioner

## 2018-09-02 NOTE — Telephone Encounter (Signed)
Patient called regarding his CT appt. I explained to the patient that the referral has been placed and the Radiology dept will call for the appt. I also gave him Radiology's number to call and ask about being scheduled for the appt. Patient was also asked to call back after being scheduled so he can get an appt in our office with Dr. Delana Meyer to discuss the results.

## 2018-09-02 NOTE — Telephone Encounter (Signed)
Ravon with Dr. Sabra Heck [phone] office called and left message requesting a return call about patient. I have called back and left message with receptionist. AS, CMA

## 2018-09-06 ENCOUNTER — Ambulatory Visit
Admission: RE | Admit: 2018-09-06 | Discharge: 2018-09-06 | Disposition: A | Discharge status: Home / Self Care | Payer: PPO | Source: Ambulatory Visit | Attending: Vascular Surgery | Admitting: Vascular Surgery

## 2018-09-06 ENCOUNTER — Other Ambulatory Visit: Payer: Self-pay

## 2018-09-06 DIAGNOSIS — I6521 Occlusion and stenosis of right carotid artery: Secondary | ICD-10-CM | POA: Diagnosis not present

## 2018-09-06 DIAGNOSIS — I6523 Occlusion and stenosis of bilateral carotid arteries: Secondary | ICD-10-CM | POA: Diagnosis not present

## 2018-09-06 LAB — POCT I-STAT CREATININE: Creatinine, Ser: 1.1 mg/dL (ref 0.61–1.24)

## 2018-09-06 MED ORDER — IOHEXOL 350 MG/ML SOLN
75.0000 mL | Freq: Once | INTRAVENOUS | Status: AC | PRN
  Administered 2018-09-06: 15:00:00 75 mL via INTRAVENOUS

## 2018-09-15 ENCOUNTER — Encounter (INDEPENDENT_AMBULATORY_CARE_PROVIDER_SITE_OTHER): Payer: Self-pay | Admitting: Vascular Surgery

## 2018-09-15 ENCOUNTER — Other Ambulatory Visit: Payer: Self-pay

## 2018-09-15 ENCOUNTER — Ambulatory Visit (INDEPENDENT_AMBULATORY_CARE_PROVIDER_SITE_OTHER): Payer: PPO | Admitting: Vascular Surgery

## 2018-09-15 VITALS — BP 174/72 | HR 55 | Resp 16 | Ht 71.0 in | Wt 201.0 lb

## 2018-09-15 DIAGNOSIS — I6521 Occlusion and stenosis of right carotid artery: Secondary | ICD-10-CM | POA: Diagnosis not present

## 2018-09-15 DIAGNOSIS — I2510 Atherosclerotic heart disease of native coronary artery without angina pectoris: Secondary | ICD-10-CM

## 2018-09-15 DIAGNOSIS — I10 Essential (primary) hypertension: Secondary | ICD-10-CM | POA: Diagnosis not present

## 2018-09-15 DIAGNOSIS — E782 Mixed hyperlipidemia: Secondary | ICD-10-CM | POA: Diagnosis not present

## 2018-09-15 NOTE — Progress Notes (Signed)
MRN : [id number]  Daniel Hanna is a 78 y.o. (1940/07/28) male who presents with chief complaint of  Chief Complaint  Patient presents with  . Follow-up    ct results  .  History of Present Illness:   The patient is seen for follow up evaluation of carotid stenosis status post CT angiogram.  Patient reports that the test went well with no problems or complications.   The patient denies interval amaurosis fugax. There is no recent or interval TIA symptoms or focal motor deficits. There is no prior documented CVA.  The patient is taking enteric-coated aspirin 81 mg daily.  There is no history of migraine headaches. There is no history of seizures.  The patient has a history of coronary artery disease, no recent episodes of angina or shortness of breath. The patient denies PAD or claudication symptoms. There is a history of hyperlipidemia which is being treated with a statin.    CT angiogram is reviewed by me personally and shows 60% calcified RICA and <46% LICA stenosis.   Current Meds  Medication Sig  . amLODipine (NORVASC) 2.5 MG tablet TAKE 1 TABLET BY MOUTH ONCE DAILY  . aspirin 81 MG tablet Take 81 mg by mouth daily.  . carvedilol (COREG) 6.25 MG tablet Take 6.25 mg by mouth daily.   . clopidogrel (PLAVIX) 75 MG tablet Take 75 mg by mouth daily.  . clotrimazole-betamethasone (LOTRISONE) cream   . HYDROXYZINE HCL PO Take 25 mg by mouth as needed.   . Iron-Vitamin C (VITRON-C) 65-125 MG TABS Take by mouth.  Marland Kitchen lisinopril (PRINIVIL,ZESTRIL) 20 MG tablet Take 1 tablet (20 mg total) by mouth daily.  . pravastatin (PRAVACHOL) 20 MG tablet Take 1 tablet by mouth daily.    Past Medical History:  Diagnosis Date  . Arthritis    fingers  . Coronary artery disease CARDIOLOGIST--   DR BERRY--  LOV NOTE 01-29-2012  W/ CHART  . ED (erectile dysfunction)   . History of coronary artery stent placement    STENTS X2  TO LAD AND CIRCUMFLEX  . History of elevated PSA 2003   PSA=0.95   . History of myocardial infarction DEC 2004   S/P STENT X2  AT [facility]  . Hypercholesterolemia   . Hypertension   . LBBB (left bundle branch block)   . Prostate cancer ([place]) 02/24/12   BX=Adenocarcinoma,Gleason 3+3=6,3+4=7 PSA=5.80    Past Surgical History:  Procedure Laterality Date  . CARDIAC CATHETERIZATION  03-02-2006  DR BERRY   PATENT STENTS/ 30-40% TAPERED DISTAL LEFT MAIN AND 50-60% AV GROOVE CIRCUMFLEX PRIOR TO STENT  &  50% LESION IN MID PORTION OF LAD.  Marland Kitchen CARDIOVASCULAR STRESS TEST  05-30-2010   DR BERRY   LOW RISK SCAN/  MODERATE SIZED INEROLATERAL SCAR WITHOUT ISCHEMIA / ISCHEMIA/ NO SIGNIFICANT CHANGE FROM PREVIOUS STUDY/   . COLONOSCOPY WITH PROPOFOL N/A 09/23/2015   Procedure: COLONOSCOPY WITH PROPOFOL;  Surgeon: Manya Silvas, MD;  Location: [facility] ENDOSCOPY;  Service: Endoscopy;  Laterality: N/A;  . CORONARY ANGIOPLASTY WITH STENT PLACEMENT  DEC 2004  AT [facility]   STENTING OF CIRCUMFLEX AND LAD  . ESOPHAGOGASTRODUODENOSCOPY (EGD) WITH PROPOFOL N/A 09/23/2015   Procedure: ESOPHAGOGASTRODUODENOSCOPY (EGD) WITH PROPOFOL;  Surgeon: Manya Silvas, MD;  Location: [facility] ENDOSCOPY;  Service: Endoscopy;  Laterality: N/A;  . HEMORRHOID SURGERY    . INGUINAL HERNIA REPAIR  1980'S  . PROSTATE BIOPSY  02/29/12   ADENOCARCINOMA  . RADIOACTIVE SEED IMPLANT N/A 05/12/2012  Procedure: RADIOACTIVE SEED IMPLANT;  Surgeon: Franchot Gallo, MD;  [facility];  Service: Urology;  Laterality: N/A;  DR PORTABLE/RAD TECH NEEDED  . TRANSTHORACIC ECHOCARDIOGRAM  05-30-2010   MILD CONCENTRIC LVH/ EF 63%/ STAGE I DIASTOLIC DYSFUCTION/ MILD MR, TR, & AR/ MILD SCLEROTIC AORTIC VALVE WITHOUT STENOSIS    Social History Social History   Tobacco Use  . Smoking status: Former Smoker    Packs/day: 1.00    Years: 25.00    Pack years: 25.00    Quit date: 05/06/2001    Years since quitting: 17.3  . Smokeless tobacco: Never Used  Substance Use Topics  . Alcohol use: No  . Drug  use: No    Family History Family History  Problem Relation Age of Onset  . Diabetes Mother   . Stroke Mother   . Cancer Sister 28       unknown    Allergies  Allergen Reactions  . Codeine Other (See Comments)    Deriviatives, dysfunctional ,woozy,spinning,dizzy     REVIEW OF SYSTEMS (Negative unless checked)  Constitutional: [] Weight loss  [] Fever  [] Chills Cardiac: [] Chest pain   [] Chest pressure   [] Palpitations   [] Shortness of breath when laying flat   [] Shortness of breath with exertion. Vascular:  [] Pain in legs with walking   [] Pain in legs at rest  [] History of DVT   [] Phlebitis   [] Swelling in legs   [] Varicose veins   [] Non-healing ulcers Pulmonary:   [] Uses home oxygen   [] Productive cough   [] Hemoptysis   [] Wheeze  [] COPD   [] Asthma Neurologic:  [] Dizziness   [] Seizures   [] History of stroke   [] History of TIA  [] Aphasia   [] Vissual changes   [] Weakness or numbness in arm   [] Weakness or numbness in leg Musculoskeletal:   [] Joint swelling   [] Joint pain   [] Low back pain Hematologic:  [] Easy bruising  [] Easy bleeding   [] Hypercoagulable state   [] Anemic Gastrointestinal:  [] Diarrhea   [] Vomiting  [] Gastroesophageal reflux/heartburn   [] Difficulty swallowing. Genitourinary:  [] Chronic kidney disease   [] Difficult urination  [] Frequent urination   [] Blood in urine Skin:  [] Rashes   [] Ulcers  Psychological:  [] History of anxiety   []  History of major depression.  Physical Examination  Vitals:   09/15/18 0959  BP: (!) 174/72  Pulse: (!) 55  Resp: 16  Weight: 201 lb (91.2 kg)  Height: 5\' 11"  (1.803 m)   Body mass index is 28.03 kg/m. Gen: WD/WN, NAD Head: [place]/AT, No temporalis wasting.  Ear/Nose/Throat: Hearing grossly intact, nares w/o erythema or drainage Eyes: PER, EOMI, sclera nonicteric.  Neck: Supple, no large masses.   Pulmonary:  Good air movement, no audible wheezing bilaterally, no use of accessory muscles.  Cardiac: RRR, no JVD 4-5/6 SEM Vascular:  bilateral carotid bruit Vessel Right Left  Radial Palpable Palpable  Brachial Palpable Palpable  Carotid Palpable Palpable  Gastrointestinal: Non-distended. No guarding/no peritoneal signs.  Musculoskeletal: M/S 5/5 throughout.  No deformity or atrophy.  Neurologic: CN 2-12 intact. Symmetrical.  Speech is fluent. Motor exam as listed above. Psychiatric: Judgment intact, Mood & affect appropriate for pt's clinical situation. Dermatologic: No rashes or ulcers noted.  No changes consistent with cellulitis. Lymph : No lichenification or skin changes of chronic lymphedema.  CBC Lab Results  Component Value Date   WBC 5.2 05/05/2012   HGB 14.6 05/05/2012   HCT 41.5 05/05/2012   MCV 90.4 05/05/2012   PLT 155 05/05/2012    BMET  Component Value Date/Time   NA 141 03/24/2016 0809   K 3.9 03/24/2016 0809   CL 101 03/24/2016 0809   CO2 22 03/24/2016 0809   GLUCOSE 117 (H) 03/24/2016 0809   GLUCOSE 118 (H) 05/05/2012 0808   BUN 12 03/24/2016 0809   CREATININE 1.10 09/06/2018 1514   CALCIUM 9.4 03/24/2016 0809   GFRNONAA 79 03/24/2016 0809   GFRAA 91 03/24/2016 0809   Estimated Creatinine Clearance: 61.9 mL/min (by C-G formula based on SCr of 1.1 mg/dL).  COAG Lab Results  Component Value Date   INR 1.05 05/05/2012    Radiology Ct Angio Neck W Or Wo Contrast  Result Date: 09/06/2018 CLINICAL DATA:  Follow-up carotid stenosis seen on ultrasound. EXAM: CT ANGIOGRAPHY NECK TECHNIQUE: Multidetector CT imaging of the neck was performed using the standard protocol during bolus administration of intravenous contrast. Multiplanar CT image reconstructions and MIPs were obtained to evaluate the vascular anatomy. Carotid stenosis measurements (when applicable) are obtained utilizing NASCET criteria, using the distal internal carotid diameter as the denominator. CONTRAST:  41mL OMNIPAQUE IOHEXOL 350 MG/ML SOLN COMPARISON:  Report from 08/08/2018 carotid Doppler ultrasound FINDINGS: Aortic  arch: Standard 3 vessel aortic arch with moderate atherosclerotic plaque. No evidence of significant arch vessel origin stenosis. Mildly narrowed and kinked appearance of the right subclavian artery near its origin. Right carotid system: Patent with heavily calcified plaque at the carotid bifurcation resulting in 60% ICA origin stenosis. Mixed calcified and soft plaque approximately 1 cm more distally in the ICA results in 50% stenosis. Mild-to-moderate calcified plaque in the cavernous ICA does not result in significant stenosis. Left carotid system: Patent with moderately extensive, predominantly calcified plaque at the carotid bifurcation not resulting in 50% or greater stenosis. Intracranial atherosclerosis results in mild cavernous and proximal supraclinoid ICA stenosis. Vertebral arteries: Patent with the left being mildly dominant plaque at the vertebral artery origins results in mild stenosis on the left and no significant stenosis on the right. Skeleton: Moderate cervical spondylosis and facet arthrosis. Other neck: No evidence of cervical lymphadenopathy or mass. 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 IMPRESSION: 1. 60% proximal right ICA stenosis. 2. Atherosclerotic plaque at the left carotid bifurcation without significant stenosis. 3. Mild intracranial ICA stenoses on the left. 4. Patent vertebral arteries with mild left-sided origin stenosis. 5.  Aortic Atherosclerosis (ICD10-I70.0). Electronically Signed   By: Logan Bores M.D.   On: 09/06/2018 16:48     Assessment/Plan 1. Stenosis of right carotid artery Recommend:  Given the patient's asymptomatic subcritical stenosis no further invasive testing or surgery at this time.  Duplex ultrasound shows 60% calcified RICA and <58% LICA stenosis.  Continue antiplatelet therapy as prescribed Continue management of CAD, HTN and Hyperlipidemia Healthy heart diet,  encouraged exercise at least 4  times per week Follow up in 6 months with duplex ultrasound and physical exam  - VAS US CAROTID; Future  2. Essential hypertension Continue antihypertensive medications as already ordered, these medications have been reviewed and there are no changes at this time.   3. Mixed hyperlipidemia Continue statin as ordered and reviewed, no changes at this time   4. ASCVD (arteriosclerotic cardiovascular disease) Continue cardiac and antihypertensive medications as already ordered and reviewed, no changes at this time.  Continue statin as ordered and reviewed, no changes at this time  Nitrates PRN for chest pain   Hortencia Pilar, MD  09/15/2018 10:31 AM

## 2019-01-24 DIAGNOSIS — E782 Mixed hyperlipidemia: Secondary | ICD-10-CM | POA: Diagnosis not present

## 2019-01-24 DIAGNOSIS — E538 Deficiency of other specified B group vitamins: Secondary | ICD-10-CM | POA: Diagnosis not present

## 2019-01-31 DIAGNOSIS — I420 Dilated cardiomyopathy: Secondary | ICD-10-CM | POA: Diagnosis not present

## 2019-01-31 DIAGNOSIS — F0280 Dementia in other diseases classified elsewhere without behavioral disturbance: Secondary | ICD-10-CM | POA: Diagnosis not present

## 2019-01-31 DIAGNOSIS — C61 Malignant neoplasm of prostate: Secondary | ICD-10-CM | POA: Diagnosis not present

## 2019-01-31 DIAGNOSIS — I350 Nonrheumatic aortic (valve) stenosis: Secondary | ICD-10-CM | POA: Diagnosis not present

## 2019-01-31 DIAGNOSIS — I6521 Occlusion and stenosis of right carotid artery: Secondary | ICD-10-CM | POA: Diagnosis not present

## 2019-01-31 DIAGNOSIS — G309 Alzheimer's disease, unspecified: Secondary | ICD-10-CM | POA: Diagnosis not present

## 2019-01-31 DIAGNOSIS — E782 Mixed hyperlipidemia: Secondary | ICD-10-CM | POA: Diagnosis not present

## 2019-01-31 DIAGNOSIS — Z125 Encounter for screening for malignant neoplasm of prostate: Secondary | ICD-10-CM | POA: Diagnosis not present

## 2019-01-31 DIAGNOSIS — F0150 Vascular dementia without behavioral disturbance: Secondary | ICD-10-CM | POA: Diagnosis not present

## 2019-02-07 DIAGNOSIS — I350 Nonrheumatic aortic (valve) stenosis: Secondary | ICD-10-CM | POA: Diagnosis not present

## 2019-02-15 DIAGNOSIS — C61 Malignant neoplasm of prostate: Secondary | ICD-10-CM | POA: Diagnosis not present

## 2019-02-15 DIAGNOSIS — I6521 Occlusion and stenosis of right carotid artery: Secondary | ICD-10-CM | POA: Diagnosis not present

## 2019-02-15 DIAGNOSIS — D500 Iron deficiency anemia secondary to blood loss (chronic): Secondary | ICD-10-CM | POA: Diagnosis not present

## 2019-02-15 DIAGNOSIS — I350 Nonrheumatic aortic (valve) stenosis: Secondary | ICD-10-CM | POA: Diagnosis not present

## 2019-02-15 DIAGNOSIS — I420 Dilated cardiomyopathy: Secondary | ICD-10-CM | POA: Diagnosis not present

## 2019-03-03 ENCOUNTER — Encounter: Payer: Self-pay | Admitting: Cardiovascular Disease

## 2019-03-03 ENCOUNTER — Ambulatory Visit: Payer: PPO | Admitting: Cardiovascular Disease

## 2019-03-03 ENCOUNTER — Other Ambulatory Visit (HOSPITAL_COMMUNITY)
Admission: RE | Admit: 2019-03-03 | Discharge: 2019-03-03 | Disposition: A | Discharge status: Home / Self Care | Payer: PPO | Source: Ambulatory Visit | Attending: Cardiovascular Disease | Admitting: Cardiovascular Disease

## 2019-03-03 ENCOUNTER — Encounter (INDEPENDENT_AMBULATORY_CARE_PROVIDER_SITE_OTHER): Payer: Self-pay

## 2019-03-03 ENCOUNTER — Other Ambulatory Visit: Payer: Self-pay

## 2019-03-03 VITALS — BP 152/78 | HR 59 | Ht 71.0 in | Wt 198.4 lb

## 2019-03-03 DIAGNOSIS — I350 Nonrheumatic aortic (valve) stenosis: Secondary | ICD-10-CM

## 2019-03-03 DIAGNOSIS — Z01812 Encounter for preprocedural laboratory examination: Secondary | ICD-10-CM | POA: Insufficient documentation

## 2019-03-03 DIAGNOSIS — Z20822 Contact with and (suspected) exposure to covid-19: Secondary | ICD-10-CM | POA: Diagnosis not present

## 2019-03-03 LAB — BASIC METABOLIC PANEL
BUN/Creatinine Ratio: 17 (ref 10–24)
BUN: 18 mg/dL (ref 8–27)
CO2: 19 mmol/L — ABNORMAL LOW (ref 20–29)
Calcium: 9 mg/dL (ref 8.6–10.2)
Chloride: 104 mmol/L (ref 96–106)
Creatinine, Ser: 1.05 mg/dL (ref 0.76–1.27)
GFR calc Af Amer: 77 mL/min/{1.73_m2} (ref >59)
GFR calc non Af Amer: 67 mL/min/{1.73_m2} (ref >59)
Glucose: 102 mg/dL — ABNORMAL HIGH (ref 65–99)
Potassium: 4.4 mmol/L (ref 3.5–5.2)
Sodium: 139 mmol/L (ref 134–144)

## 2019-03-03 LAB — CBC WITH DIFFERENTIAL/PLATELET
Basophils Absolute: 0 10*3/uL (ref 0.0–0.2)
Basos: 0 %
EOS (ABSOLUTE): 0.2 10*3/uL (ref 0.0–0.4)
Eos: 2 %
Hematocrit: 43.6 % (ref 37.5–51.0)
Hemoglobin: 14.6 g/dL (ref 13.0–17.7)
Immature Grans (Abs): 0 10*3/uL (ref 0.0–0.1)
Immature Granulocytes: 0 %
Lymphocytes Absolute: 1.7 10*3/uL (ref 0.7–3.1)
Lymphs: 22 %
MCH: 31.1 pg (ref 26.6–33.0)
MCHC: 33.5 g/dL (ref 31.5–35.7)
MCV: 93 fL (ref 79–97)
Monocytes Absolute: 0.6 10*3/uL (ref 0.1–0.9)
Monocytes: 8 %
Neutrophils Absolute: 4.9 10*3/uL (ref 1.4–7.0)
Neutrophils: 68 %
Platelets: 193 10*3/uL (ref 150–450)
RBC: 4.69 x10E6/uL (ref 4.14–5.80)
RDW: 12.6 % (ref 11.6–15.4)
WBC: 7.4 10*3/uL (ref 3.4–10.8)

## 2019-03-03 LAB — SARS CORONAVIRUS 2 (TAT 6-24 HRS): SARS Coronavirus 2: NEGATIVE

## 2019-03-03 NOTE — Progress Notes (Signed)
[facility]   MULTIDISCIPLINARY HEART VALVE TEAM  Date:  03/05/2019   ID:  Daniel Hanna May 25, 1940, MRN [id number]  PCP:  Daniel Aus, MD   Chief Complaint  Patient presents with  . Shortness of Breath     HISTORY OF PRESENT ILLNESS: Daniel Hanna is a 79 y.o. male who presents for evaluation of aortic stenosis, referred by Daniel Hanna.  The patient has followed with Daniel Hanna, last seen in March 2019. He closely follows with Daniel Hanna, and has been noted to have progressive aortic stenosis, now in the severe range.   The patient is with his wife, Daniel Hanna,  today. They live in [place]. 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 He underwent 2 vessel stenting of the LAD and LCx in 2004 at [facility]. 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 The last echo in our system showed an LVEF of 35-40% with inferolateral akinesis and moderate aortic stenosis with a mean gradient of 25 mmHg.  He recently underwent a repeat echo study for surveillance.  This demonstrated marked progression of his aortic stenosis, now with a peak transvalvular velocity of 4.6 m/s and peak and mean gradients of 86 and 45 mmHg, respectively.  The LVEF is stable.  The patient reports much less physical activity than in the past, but he remains functionally independent.   With his current activity level, he denies symptoms of chest pain, shortness of breath, heart palpitations, orthopnea, or PND. He does have lightheadedness with postural change. He denies physical limitation from arthritis.   Past Medical History:  Diagnosis Date  . Arthritis    fingers  . Coronary artery disease CARDIOLOGIST--   Daniel Hanna--  LOV NOTE 01-29-2012  W/ CHART  . ED (erectile dysfunction)   . History of coronary artery stent placement    STENTS X2  TO LAD AND CIRCUMFLEX  . History of elevated PSA 2003   PSA=0.95  . History of myocardial infarction DEC 2004   S/P STENT X2  AT [facility]  . Hypercholesterolemia   . Hypertension   . LBBB (left bundle branch block)   . Prostate cancer ([place]) 02/24/12   BX=Adenocarcinoma,Gleason 3+3=6,3+4=7 PSA=5.80    Current Outpatient Medications  Medication Sig Dispense Refill  . amLODipine (NORVASC) 2.5 MG tablet TAKE 1 TABLET BY MOUTH ONCE DAILY    . aspirin 81 MG tablet Take 81 mg by mouth daily.    . carvedilol (COREG) 6.25 MG tablet Take 6.25 mg by mouth daily.     . clopidogrel (PLAVIX) 75 MG tablet Take 75 mg by mouth daily.    Marland Kitchen HYDROXYZINE HCL PO Take 25 mg by mouth as needed.     Marland Kitchen lisinopril (PRINIVIL,ZESTRIL) 20 MG tablet Take 1 tablet (20 mg total) by mouth daily. 30 tablet 6  . pravastatin (PRAVACHOL) 20 MG tablet Take 1 tablet by mouth daily.    . clotrimazole-betamethasone (LOTRISONE) cream     . Iron-Vitamin C (VITRON-C) 65-125 MG TABS Take by mouth.     No current facility-administered medications for this visit.    ALLERGIES:   Codeine   SOCIAL HISTORY:  The patient  reports that he quit smoking about 17 years ago. He has a 25.00 pack-year smoking history. He has never used smokeless tobacco. He reports that he does not drink alcohol or use drugs.  FAMILY HISTORY:  The patient's family history includes Cancer (age of onset: 40) in his sister; Diabetes in his mother; Stroke in his mother.   REVIEW OF SYSTEMS:  Positive for memory loss.   All other systems are reviewed and negative.   PHYSICAL EXAM: VS:  BP (!) 152/78   Pulse (!) 59   Ht 5\' 11"  (1.803 m)   Wt 198 lb 6.4 oz (90 kg)   SpO2 96%   BMI 27.67 kg/m  , BMI Body mass index is 27.67 kg/m. GEN: Well nourished, well developed, elderly male in no acute distress HEENT: normal Neck: No JVD. carotids 2+ with bilateral bruits Cardiac: The heart is RRR with a 3/6 harsh late peaking systolic murmur at the RUSB. No edema. Pedal pulses 2+ = bilaterally  Respiratory:  clear to  auscultation bilaterally GI: soft, nontender, nondistended, + BS MS: no deformity or atrophy Skin: warm and dry, no rash Neuro:  Strength and sensation are intact Psych: euthymic mood, full affect  EKG:  EKG from today reviewed and demonstrates NSR 59 bpm, LBBB, no significant change from prior  RECENT LABS: 03/03/2019: BUN 18; Creatinine, Ser 1.05; Hemoglobin 14.6; Platelets 193; Potassium 4.4; Sodium 139  No results Hanna for requested labs within last 8760 hours.   Estimated Creatinine Clearance: 59.8 mL/min (by C-G formula based on SCr of 1.05 mg/dL).   Wt Readings from Last 3 Encounters:  03/03/19 198 lb 6.4 oz (90 kg)  09/15/18 201 lb (91.2 kg)  08/29/18 201 lb (91.2 kg)     CARDIAC STUDIES: Echo 02-10-2019: ECHOCARDIOGRAPHIC MEASUREMENTS 2D DIMENSIONS AORTA         Values  Normal Range  MAIN PA     Values  Normal Range        Annulus: nm*     [2.3-2.9]     PA Main: nm*    [1.5-2.1]       Aorta Sin: nm*     [3.1-3.7]  RIGHT VENTRICLE      ST Junction: nm*     [2.6-3.2]     RV Base: 4.0 cm  [<4.2]       Asc.Aorta: nm*     [2.6-3.4]     RV Mid: nm*    [<3.5] LEFT VENTRICLE                   RV Length: nm*    [<8.6]         LVIDd: 4.3 cm    [4.2-5.9]  INFERIOR VENA CAVA         LVIDs: 3.3 cm            Max. IVC: nm*    [<=2.1]           FS: 24.2 %    [>25]      Min. IVC: nm*          SWT: 1.2 cm    [0.6-1.0]  ------------------          PWT: 0.64 cm   [0.6-1.0]  nm* - not measured LEFT ATRIUM        LA Diam: 4.7 cm    [3.0-4.0]      LA A4C Area: nm*     [<20]       LA Volume: nm*     [18-58] _________________________________________________________________________________________ ECHOCARDIOGRAPHIC DESCRIPTIONS AORTIC ROOT          Size: Normal        Dissection: INDETERM FOR DISSECTION AORTIC VALVE  Leaflets: Tricuspid          Morphology: SEVERELY THICKENED        Mobility: PARTIALLY MOBILE LEFT VENTRICLE          Size: Normal            Anterior: Normal      Contraction: Normal             Lateral: Normal       Closest EF: 50% (Estimated)         Septal: Normal       LV Masses: No Masses            Apical: Normal          LVH: None             Inferior: Normal                           Posterior: Normal      Dias.FxClass: (Grade 1) relaxation abnormal, E/A reversal           EF 45-50% MITRAL VALVE        Leaflets: Normal            Mobility: Fully mobile       Morphology: Normal LEFT ATRIUM          Size: MILDLY ENLARGED       LA Masses: No masses       IA Septum: Normal IAS MAIN PA          Size: Normal PULMONIC VALVE       Morphology: Normal            Mobility: Fully mobile RIGHT VENTRICLE       RV Masses: No Masses             Size: Normal       Free Wall: Normal           Contraction: Normal TRICUSPID VALVE        Leaflets: Normal            Mobility: Fully mobile       Morphology: Normal RIGHT ATRIUM          Size: Normal            RA Other: None        RA Mass: No masses PERICARDIUM         Fluid: No effusion INFERIOR VENACAVA          Size: Normal Normal respiratory collapse _________________________________________________________________________________________  DOPPLER ECHO and OTHER SPECIAL PROCEDURES         Aortic: MILD AR          SEVERE AS             464.8 cm/sec peak vel   86.4 mmHg peak grad             45.4 mmHg mean grad    0.91  cm^2 by DOPPLER         Mitral: MODERATE MR        No MS             MV Inflow E Vel = 111.0 cm/sec   MV Annulus E'Vel = nm*             E/E'Ratio = nm*       Tricuspid: MILD TR          No TS  300.7 cm/sec peak TR vel  39.2 mmHg peak RV pressure       Pulmonary: TRIVIAL PR         No PS _________________________________________________________________________________________ INTERPRETATION MILD LV SYSTOLIC DYSFUNCTION (See above) NORMAL RIGHT VENTRICULAR SYSTOLIC FUNCTION MODERATE VALVULAR REGURGITATION (See above) SEVERE VALVULAR STENOSIS (See above) Severe AS with peak velocity 4.8 m/s, mean gradient 93mmHg  STS RISK CALCULATOR: Isolated AVR: Risk of Mortality: 1.690% Renal Failure: 1.686% Permanent Stroke: 2.214% Prolonged Ventilation: 4.944% DSW Infection: 0.089% Reoperation: 4.668% Morbidity or Mortality: 9.593% Short Length of Stay: 40.310% Long Length of Stay: 3.157%  AVR/CABG: Risk of Mortality: 1.806% Renal Failure: 1.051% Permanent Stroke: 1.501% Prolonged Ventilation: 5.571% DSW Infection: 0.150% Reoperation: 4.224% Morbidity or Mortality: 10.481% Short Length of Stay: 40.456% Long Length of Stay: 4.359%  ASSESSMENT AND PLAN: 1.  Severe Stage C aortic stenosis. The patient has mild LV dysfunction with LVEF 45-50%. Considering the degree of progression over the past 2 years, now with aortic stenosis in the severe range along with the presence of CAD and LV dysfunction, I think the patient would benefit from aortic valve replacement.   I have reviewed the natural history of aortic stenosis with the patient and their family members who are present today. We have discussed the limitations of medical therapy and the poor prognosis associated with symptomatic aortic stenosis. We have reviewed potential treatment options, including palliative medical therapy,  conventional surgical aortic valve replacement, and transcatheter aortic valve replacement. We discussed treatment options in the context of the patient's specific comorbid medical conditions.    Considering his age and mild memory impairment, TAVR will likely be the best treatment option. He will need to undergo preoperative studies with R/L heart catheterization, CTA of the heart, and CTA of the chest, abdomen, and pelvis, to determine anatomic suitability for TAVR. I have reviewed the risks, indications, and alternatives to cardiac catheterization, possible angioplasty, and stenting with the patient. Risks include but are not limited to bleeding, infection, vascular injury, stroke, myocardial infection, arrhythmia, kidney injury, radiation-related injury in the case of prolonged fluoroscopy use, emergency cardiac surgery, and death. The patient understands the risks of serious complication is 1-2 in [id number] with diagnostic cardiac cath and 1-2% or less with angioplasty/stenting. Following these studies, he will be referred for formal cardiac surgical consultation as part of a multidisciplinary approach to his care.   Deatra James 03/05/2019 8:05 AM     [facility] [address] [place] [address]  [phone] (office) [phone] (fax)

## 2019-03-03 NOTE — Patient Instructions (Addendum)
COVID SCREENING INFORMATION: You are scheduled for your COVID screening TODAY. [facility]) [address] Stay in the RIGHT lane and proceed under the brick awning and tell them you are there for pre-procedure testing Do NOT bring any pets with you to the testing site  CATHETERIZATION INSTRUCTIONS: You are scheduled for a Cardiac Catheterization on: Tuesday, March 07, 2019.  1. Please arrive at the [facility] (Main Entrance A) at  [facility]: [place], [place] [address] at:  (This time is two hours before your procedure to ensure your preparation). Free valet parking service is available. You are allowed ONE visitor in the waiting room during your procedure. Both you and your guest must wear masks. Special note: Every effort is made to have your procedure done on time. Please understand that emergencies sometimes delay scheduled procedures.  2. Diet: Do not eat solid foods after midnight.  You may have clear liquids until 5am upon the day of the procedure.  3. Labs: Pre-procedure labs will be drawn TODAY!  4. Medication instructions in preparation for your procedure:  1) Make sure to take your ASPIRIN and PLAVIX the morning of your cath.  2) You may take your other medications as directed with sips of water.  5. Plan for one night stay--bring personal belongings. 6. Bring a current list of your medications and current insurance cards. 7. You MUST have a responsible person to drive you home. 8. Someone MUST be with you the first 24 hours after you arrive home or your discharge will be delayed. 9. Please wear clothes that are easy to get on and off and wear slip-on shoes.  Thank you for allowing [place] to care for you!   -- [facility]

## 2019-03-03 NOTE — H&P (View-Only) (Signed)
[facility]   MULTIDISCIPLINARY HEART VALVE TEAM  Date:  03/05/2019   ID:  Tashan, Cruzhernandez March 09, 1940, MRN [id number]  PCP:  Rusty Aus, MD   Chief Complaint  Patient presents with  . Shortness of Breath     HISTORY OF PRESENT ILLNESS: Daniel Hanna is a 79 y.o. male who presents for evaluation of aortic stenosis, referred by Dr Sabra Heck.  The patient has followed with Dr Gwenlyn Found, last seen in March 2019. He closely follows with Dr Sabra Heck, and has been noted to have progressive aortic stenosis, now in the severe range.   The patient is with his wife, Olegario Shearer,  today. They live in [place]. 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 He underwent 2 vessel stenting of the LAD and LCx in 2004 at [facility]. 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 The last echo in our system showed an LVEF of 35-40% with inferolateral akinesis and moderate aortic stenosis with a mean gradient of 25 mmHg.  He recently underwent a repeat echo study for surveillance.  This demonstrated marked progression of his aortic stenosis, now with a peak transvalvular velocity of 4.6 m/s and peak and mean gradients of 86 and 45 mmHg, respectively.  The LVEF is stable.  The patient reports much less physical activity than in the past, but he remains functionally independent.   With his current activity level, he denies symptoms of chest pain, shortness of breath, heart palpitations, orthopnea, or PND. He does have lightheadedness with postural change. He denies physical limitation from arthritis.   Past Medical History:  Diagnosis Date  . Arthritis    fingers  . Coronary artery disease CARDIOLOGIST--   DR BERRY--  LOV NOTE 01-29-2012  W/ CHART  . ED (erectile dysfunction)   . History of coronary artery stent placement    STENTS X2  TO LAD AND CIRCUMFLEX  . History of elevated PSA 2003   PSA=0.95  . History of myocardial infarction DEC 2004   S/P STENT X2  AT [facility]  . Hypercholesterolemia   . Hypertension   . LBBB (left bundle branch block)   . Prostate cancer ([place]) 02/24/12   BX=Adenocarcinoma,Gleason 3+3=6,3+4=7 PSA=5.80    Current Outpatient Medications  Medication Sig Dispense Refill  . amLODipine (NORVASC) 2.5 MG tablet TAKE 1 TABLET BY MOUTH ONCE DAILY    . aspirin 81 MG tablet Take 81 mg by mouth daily.    . carvedilol (COREG) 6.25 MG tablet Take 6.25 mg by mouth daily.     . clopidogrel (PLAVIX) 75 MG tablet Take 75 mg by mouth daily.    Marland Kitchen HYDROXYZINE HCL PO Take 25 mg by mouth as needed.     Marland Kitchen lisinopril (PRINIVIL,ZESTRIL) 20 MG tablet Take 1 tablet (20 mg total) by mouth daily. 30 tablet 6  . pravastatin (PRAVACHOL) 20 MG tablet Take 1 tablet by mouth daily.    . clotrimazole-betamethasone (LOTRISONE) cream     . Iron-Vitamin C (VITRON-C) 65-125 MG TABS Take by mouth.     No current facility-administered medications for this visit.    ALLERGIES:   Codeine   SOCIAL HISTORY:  The patient  reports that he quit smoking about 17 years ago. He has a 25.00 pack-year smoking history. He has never used smokeless tobacco. He reports that he does not drink alcohol or use drugs.  FAMILY HISTORY:  The patient's family history includes Cancer (age of onset: 64) in his sister; Diabetes in his mother; Stroke in his mother.   REVIEW OF SYSTEMS:  Positive for memory loss.   All other systems are reviewed and negative.   PHYSICAL EXAM: VS:  BP (!) 152/78   Pulse (!) 59   Ht 5\' 11"  (1.803 m)   Wt 198 lb 6.4 oz (90 kg)   SpO2 96%   BMI 27.67 kg/m  , BMI Body mass index is 27.67 kg/m. GEN: Well nourished, well developed, elderly male in no acute distress HEENT: normal Neck: No JVD. carotids 2+ with bilateral bruits Cardiac: The heart is RRR with a 3/6 harsh late peaking systolic murmur at the RUSB. No edema. Pedal pulses 2+ = bilaterally  Respiratory:  clear to  auscultation bilaterally GI: soft, nontender, nondistended, + BS MS: no deformity or atrophy Skin: warm and dry, no rash Neuro:  Strength and sensation are intact Psych: euthymic mood, full affect  EKG:  EKG from today reviewed and demonstrates NSR 59 bpm, LBBB, no significant change from prior  RECENT LABS: 03/03/2019: BUN 18; Creatinine, Ser 1.05; Hemoglobin 14.6; Platelets 193; Potassium 4.4; Sodium 139  No results found for requested labs within last 8760 hours.   Estimated Creatinine Clearance: 59.8 mL/min (by C-G formula based on SCr of 1.05 mg/dL).   Wt Readings from Last 3 Encounters:  03/03/19 198 lb 6.4 oz (90 kg)  09/15/18 201 lb (91.2 kg)  08/29/18 201 lb (91.2 kg)     CARDIAC STUDIES: Echo 02-10-2019: ECHOCARDIOGRAPHIC MEASUREMENTS 2D DIMENSIONS AORTA         Values  Normal Range  MAIN PA     Values  Normal Range        Annulus: nm*     [2.3-2.9]     PA Main: nm*    [1.5-2.1]       Aorta Sin: nm*     [3.1-3.7]  RIGHT VENTRICLE      ST Junction: nm*     [2.6-3.2]     RV Base: 4.0 cm  [<4.2]       Asc.Aorta: nm*     [2.6-3.4]     RV Mid: nm*    [<3.5] LEFT VENTRICLE                   RV Length: nm*    [<8.6]         LVIDd: 4.3 cm    [4.2-5.9]  INFERIOR VENA CAVA         LVIDs: 3.3 cm            Max. IVC: nm*    [<=2.1]           FS: 24.2 %    [>25]      Min. IVC: nm*          SWT: 1.2 cm    [0.6-1.0]  ------------------          PWT: 0.64 cm   [0.6-1.0]  nm* - not measured LEFT ATRIUM        LA Diam: 4.7 cm    [3.0-4.0]      LA A4C Area: nm*     [<20]       LA Volume: nm*     [18-58] _________________________________________________________________________________________ ECHOCARDIOGRAPHIC DESCRIPTIONS AORTIC ROOT          Size: Normal        Dissection: INDETERM FOR DISSECTION AORTIC VALVE  Leaflets: Tricuspid          Morphology: SEVERELY THICKENED        Mobility: PARTIALLY MOBILE LEFT VENTRICLE          Size: Normal            Anterior: Normal      Contraction: Normal             Lateral: Normal       Closest EF: 50% (Estimated)         Septal: Normal       LV Masses: No Masses            Apical: Normal          LVH: None             Inferior: Normal                           Posterior: Normal      Dias.FxClass: (Grade 1) relaxation abnormal, E/A reversal           EF 45-50% MITRAL VALVE        Leaflets: Normal            Mobility: Fully mobile       Morphology: Normal LEFT ATRIUM          Size: MILDLY ENLARGED       LA Masses: No masses       IA Septum: Normal IAS MAIN PA          Size: Normal PULMONIC VALVE       Morphology: Normal            Mobility: Fully mobile RIGHT VENTRICLE       RV Masses: No Masses             Size: Normal       Free Wall: Normal           Contraction: Normal TRICUSPID VALVE        Leaflets: Normal            Mobility: Fully mobile       Morphology: Normal RIGHT ATRIUM          Size: Normal            RA Other: None        RA Mass: No masses PERICARDIUM         Fluid: No effusion INFERIOR VENACAVA          Size: Normal Normal respiratory collapse _________________________________________________________________________________________  DOPPLER ECHO and OTHER SPECIAL PROCEDURES         Aortic: MILD AR          SEVERE AS             464.8 cm/sec peak vel   86.4 mmHg peak grad             45.4 mmHg mean grad    0.91  cm^2 by DOPPLER         Mitral: MODERATE MR        No MS             MV Inflow E Vel = 111.0 cm/sec   MV Annulus E'Vel = nm*             E/E'Ratio = nm*       Tricuspid: MILD TR          No TS  300.7 cm/sec peak TR vel  39.2 mmHg peak RV pressure       Pulmonary: TRIVIAL PR         No PS _________________________________________________________________________________________ INTERPRETATION MILD LV SYSTOLIC DYSFUNCTION (See above) NORMAL RIGHT VENTRICULAR SYSTOLIC FUNCTION MODERATE VALVULAR REGURGITATION (See above) SEVERE VALVULAR STENOSIS (See above) Severe AS with peak velocity 4.8 m/s, mean gradient 25mmHg  STS RISK CALCULATOR: Isolated AVR: Risk of Mortality: 1.690% Renal Failure: 1.686% Permanent Stroke: 2.214% Prolonged Ventilation: 4.944% DSW Infection: 0.089% Reoperation: 4.668% Morbidity or Mortality: 9.593% Short Length of Stay: 40.310% Long Length of Stay: 3.157%  AVR/CABG: Risk of Mortality: 1.806% Renal Failure: 1.051% Permanent Stroke: 1.501% Prolonged Ventilation: 5.571% DSW Infection: 0.150% Reoperation: 4.224% Morbidity or Mortality: 10.481% Short Length of Stay: 40.456% Long Length of Stay: 4.359%  ASSESSMENT AND PLAN: 1.  Severe Stage C aortic stenosis. The patient has mild LV dysfunction with LVEF 45-50%. Considering the degree of progression over the past 2 years, now with aortic stenosis in the severe range along with the presence of CAD and LV dysfunction, I think the patient would benefit from aortic valve replacement.   I have reviewed the natural history of aortic stenosis with the patient and their family members who are present today. We have discussed the limitations of medical therapy and the poor prognosis associated with symptomatic aortic stenosis. We have reviewed potential treatment options, including palliative medical therapy,  conventional surgical aortic valve replacement, and transcatheter aortic valve replacement. We discussed treatment options in the context of the patient's specific comorbid medical conditions.    Considering his age and mild memory impairment, TAVR will likely be the best treatment option. He will need to undergo preoperative studies with R/L heart catheterization, CTA of the heart, and CTA of the chest, abdomen, and pelvis, to determine anatomic suitability for TAVR. I have reviewed the risks, indications, and alternatives to cardiac catheterization, possible angioplasty, and stenting with the patient. Risks include but are not limited to bleeding, infection, vascular injury, stroke, myocardial infection, arrhythmia, kidney injury, radiation-related injury in the case of prolonged fluoroscopy use, emergency cardiac surgery, and death. The patient understands the risks of serious complication is 1-2 in [id number] with diagnostic cardiac cath and 1-2% or less with angioplasty/stenting. Following these studies, he will be referred for formal cardiac surgical consultation as part of a multidisciplinary approach to his care.   Deatra James 03/05/2019 8:05 AM     [facility] [address] [place] [address]  [phone] (office) [phone] (fax)

## 2019-03-04 ENCOUNTER — Encounter: Payer: Self-pay | Admitting: Cardiovascular Disease

## 2019-03-06 ENCOUNTER — Telehealth: Payer: Self-pay | Admitting: *Deleted

## 2019-03-06 NOTE — Telephone Encounter (Signed)
Pt contacted pre-catheterization scheduled at [facility] for: Tuesday March 07, 2019 10:30 AM Verified arrival time and place: [facility]) at: 8:30 AM   No solid food after midnight prior to cath, clear liquids until 5 AM day of procedure. Contrast allergy: no  AM meds can be  taken pre-cath with sip of water including: ASA 81 mg Plavix 75 mg  Confirmed patient has responsible adult to drive home post procedure and observe 24 hours after arriving home: yes  Currently, due to Covid-19 pandemic, only one person will be allowed with patient. Must be the same person for patient's entire stay and will be required to wear a mask. They will be asked to wait in the waiting room for the duration of the patient's stay.  Patients are required to wear a mask when they enter the hospital.      COVID-19 Pre-Screening Questions:  . In the past 7 to 10 days have you had a cough,  shortness of breath, headache, congestion, fever (100 or greater) body aches, chills, sore throat, or sudden loss of taste or sense of smell? no . Have you been around anyone with known Covid 19? no . Have you been around anyone who is awaiting Covid 19 test results in the past 7 to 10 days? no . Have you been around anyone who has been exposed to Covid 19, or has mentioned symptoms of Covid 19 within the past 7 to 10 days? no  I reviewed procedure/mask/visitor instructions, Covid-19 screening questions with patient, he verbalized understanding, thanked me for call.

## 2019-03-06 NOTE — Telephone Encounter (Signed)
Unable to locate EKG done 03/03/19-will need EKG tomorrow morning prior to procedure, pt is aware.

## 2019-03-07 ENCOUNTER — Other Ambulatory Visit: Payer: Self-pay

## 2019-03-07 ENCOUNTER — Ambulatory Visit (HOSPITAL_BASED_OUTPATIENT_CLINIC_OR_DEPARTMENT_OTHER): Payer: PPO

## 2019-03-07 ENCOUNTER — Ambulatory Visit (HOSPITAL_COMMUNITY)
Admission: RE | Admit: 2019-03-07 | Discharge: 2019-03-07 | Disposition: A | Discharge status: Home / Self Care | Payer: PPO | Attending: Cardiovascular Disease | Admitting: Cardiovascular Disease

## 2019-03-07 ENCOUNTER — Encounter (HOSPITAL_COMMUNITY)
Admission: RE | Disposition: A | Discharge status: Home / Self Care | Payer: PPO | Source: Home / Self Care | Attending: Cardiovascular Disease

## 2019-03-07 DIAGNOSIS — Q245 Malformation of coronary vessels: Secondary | ICD-10-CM | POA: Insufficient documentation

## 2019-03-07 DIAGNOSIS — Z87891 Personal history of nicotine dependence: Secondary | ICD-10-CM | POA: Insufficient documentation

## 2019-03-07 DIAGNOSIS — Z955 Presence of coronary angioplasty implant and graft: Secondary | ICD-10-CM | POA: Insufficient documentation

## 2019-03-07 DIAGNOSIS — E7800 Pure hypercholesterolemia, unspecified: Secondary | ICD-10-CM | POA: Diagnosis not present

## 2019-03-07 DIAGNOSIS — I10 Essential (primary) hypertension: Secondary | ICD-10-CM | POA: Diagnosis not present

## 2019-03-07 DIAGNOSIS — Z885 Allergy status to narcotic agent status: Secondary | ICD-10-CM | POA: Diagnosis not present

## 2019-03-07 DIAGNOSIS — I2510 Atherosclerotic heart disease of native coronary artery without angina pectoris: Secondary | ICD-10-CM | POA: Insufficient documentation

## 2019-03-07 DIAGNOSIS — I2584 Coronary atherosclerosis due to calcified coronary lesion: Secondary | ICD-10-CM | POA: Insufficient documentation

## 2019-03-07 DIAGNOSIS — Z7982 Long term (current) use of aspirin: Secondary | ICD-10-CM | POA: Insufficient documentation

## 2019-03-07 DIAGNOSIS — I350 Nonrheumatic aortic (valve) stenosis: Secondary | ICD-10-CM | POA: Diagnosis not present

## 2019-03-07 DIAGNOSIS — I252 Old myocardial infarction: Secondary | ICD-10-CM | POA: Diagnosis not present

## 2019-03-07 DIAGNOSIS — I080 Rheumatic disorders of both mitral and aortic valves: Secondary | ICD-10-CM | POA: Diagnosis not present

## 2019-03-07 DIAGNOSIS — I351 Nonrheumatic aortic (valve) insufficiency: Secondary | ICD-10-CM

## 2019-03-07 DIAGNOSIS — I2582 Chronic total occlusion of coronary artery: Secondary | ICD-10-CM | POA: Insufficient documentation

## 2019-03-07 DIAGNOSIS — Z79899 Other long term (current) drug therapy: Secondary | ICD-10-CM | POA: Diagnosis not present

## 2019-03-07 DIAGNOSIS — M1990 Unspecified osteoarthritis, unspecified site: Secondary | ICD-10-CM | POA: Diagnosis not present

## 2019-03-07 DIAGNOSIS — I340 Nonrheumatic mitral (valve) insufficiency: Secondary | ICD-10-CM

## 2019-03-07 DIAGNOSIS — R0602 Shortness of breath: Secondary | ICD-10-CM | POA: Diagnosis not present

## 2019-03-07 DIAGNOSIS — Z7902 Long term (current) use of antithrombotics/antiplatelets: Secondary | ICD-10-CM | POA: Insufficient documentation

## 2019-03-07 DIAGNOSIS — I420 Dilated cardiomyopathy: Secondary | ICD-10-CM | POA: Insufficient documentation

## 2019-03-07 HISTORY — PX: RIGHT/LEFT HEART CATH AND CORONARY ANGIOGRAPHY: CATH118266

## 2019-03-07 LAB — POCT I-STAT EG7
Acid-base deficit: 1 mmol/L (ref 0.0–2.0)
Acid-base deficit: 1 mmol/L (ref 0.0–2.0)
Bicarbonate: 24.9 mmol/L (ref 20.0–28.0)
Bicarbonate: 24.2 mmol/L (ref 20.0–28.0)
Calcium, Ion: 1.18 mmol/L (ref 1.15–1.40)
Calcium, Ion: 1.1 mmol/L — ABNORMAL LOW (ref 1.15–1.40)
HCT: 37 % — ABNORMAL LOW (ref 39.0–52.0)
HCT: 37 % — ABNORMAL LOW (ref 39.0–52.0)
Hemoglobin: 12.6 g/dL — ABNORMAL LOW (ref 13.0–17.0)
Hemoglobin: 12.6 g/dL — ABNORMAL LOW (ref 13.0–17.0)
O2 Saturation: 75 %
O2 Saturation: 80 %
Potassium: 3.6 mmol/L (ref 3.5–5.1)
Potassium: 3.5 mmol/L (ref 3.5–5.1)
Sodium: 144 mmol/L (ref 135–145)
Sodium: 143 mmol/L (ref 135–145)
TCO2: 26 mmol/L (ref 22–32)
TCO2: 26 mmol/L (ref 22–32)
pCO2, Ven: 42.6 mmHg — ABNORMAL LOW (ref 44.0–60.0)
pCO2, Ven: 43.7 mmHg — ABNORMAL LOW (ref 44.0–60.0)
pH, Ven: 7.374 (ref 7.250–7.430)
pH, Ven: 7.352 (ref 7.250–7.430)
pO2, Ven: 41 mmHg (ref 32.0–45.0)
pO2, Ven: 46 mmHg — ABNORMAL HIGH (ref 32.0–45.0)

## 2019-03-07 LAB — POCT I-STAT 7, (LYTES, BLD GAS, ICA,H+H)
Acid-base deficit: 2 mmol/L (ref 0.0–2.0)
Bicarbonate: 23.4 mmol/L (ref 20.0–28.0)
Calcium, Ion: 1.19 mmol/L (ref 1.15–1.40)
HCT: 38 % — ABNORMAL LOW (ref 39.0–52.0)
Hemoglobin: 12.9 g/dL — ABNORMAL LOW (ref 13.0–17.0)
O2 Saturation: 96 %
Potassium: 3.7 mmol/L (ref 3.5–5.1)
Sodium: 136 mmol/L (ref 135–145)
TCO2: 25 mmol/L (ref 22–32)
pCO2 arterial: 42.3 mmHg (ref 32.0–48.0)
pH, Arterial: 7.35 (ref 7.350–7.450)
pO2, Arterial: 85 mmHg (ref 83.0–108.0)

## 2019-03-07 LAB — ECHOCARDIOGRAM COMPLETE
Height: 71 in
Weight: 3168 oz

## 2019-03-07 SURGERY — RIGHT/LEFT HEART CATH AND CORONARY ANGIOGRAPHY
Anesthesia: LOCAL

## 2019-03-07 MED ORDER — LABETALOL HCL 5 MG/ML IV SOLN: 10.0000 mg | INTRAVENOUS | Status: DC | PRN

## 2019-03-07 MED ORDER — LIDOCAINE HCL (PF) 1 % IJ SOLN
INTRAMUSCULAR | Status: AC
  Filled 2019-03-07: qty 30

## 2019-03-07 MED ORDER — SODIUM CHLORIDE 0.9% FLUSH: 3.0000 mL | INTRAVENOUS | Status: DC | PRN

## 2019-03-07 MED ORDER — ACETAMINOPHEN 325 MG PO TABS: 650.0000 mg | ORAL_TABLET | ORAL | Status: DC | PRN

## 2019-03-07 MED ORDER — SODIUM CHLORIDE 0.9 % WEIGHT BASED INFUSION: 1.0000 mL/kg/h | INTRAVENOUS | Status: DC

## 2019-03-07 MED ORDER — VERAPAMIL HCL 2.5 MG/ML IV SOLN
INTRAVENOUS | Status: DC | PRN
  Administered 2019-03-07: 10 mL via INTRA_ARTERIAL

## 2019-03-07 MED ORDER — FENTANYL CITRATE (PF) 100 MCG/2ML IJ SOLN
INTRAMUSCULAR | Status: DC | PRN
  Administered 2019-03-07: 25 ug via INTRAVENOUS

## 2019-03-07 MED ORDER — MIDAZOLAM HCL 2 MG/2ML IJ SOLN
INTRAMUSCULAR | Status: DC | PRN
  Administered 2019-03-07: 1 mg via INTRAVENOUS

## 2019-03-07 MED ORDER — SODIUM CHLORIDE 0.9% FLUSH: 3.0000 mL | Freq: Two times a day (BID) | INTRAVENOUS | Status: DC

## 2019-03-07 MED ORDER — IOHEXOL 350 MG/ML SOLN
INTRAVENOUS | Status: DC | PRN
  Administered 2019-03-07: 120 mL

## 2019-03-07 MED ORDER — ASPIRIN 81 MG PO CHEW: 81.0000 mg | CHEWABLE_TABLET | ORAL | Status: DC

## 2019-03-07 MED ORDER — CLOPIDOGREL BISULFATE 75 MG PO TABS: 75.0000 mg | ORAL_TABLET | ORAL | Status: DC

## 2019-03-07 MED ORDER — HEPARIN SODIUM (PORCINE) 1000 UNIT/ML IJ SOLN
INTRAMUSCULAR | Status: DC | PRN
  Administered 2019-03-07: 5000 [IU] via INTRAVENOUS

## 2019-03-07 MED ORDER — MIDAZOLAM HCL 2 MG/2ML IJ SOLN
INTRAMUSCULAR | Status: AC
  Filled 2019-03-07: qty 2

## 2019-03-07 MED ORDER — SODIUM CHLORIDE 0.9 % IV SOLN: 250.0000 mL | INTRAVENOUS | Status: DC | PRN

## 2019-03-07 MED ORDER — VERAPAMIL HCL 2.5 MG/ML IV SOLN
INTRAVENOUS | Status: AC
  Filled 2019-03-07: qty 2

## 2019-03-07 MED ORDER — SODIUM CHLORIDE 0.9 % WEIGHT BASED INFUSION
3.0000 mL/kg/h | INTRAVENOUS | Status: AC
  Administered 2019-03-07: 3 mL/kg/h via INTRAVENOUS

## 2019-03-07 MED ORDER — HYDRALAZINE HCL 20 MG/ML IJ SOLN: 10.0000 mg | INTRAMUSCULAR | Status: DC | PRN

## 2019-03-07 MED ORDER — ONDANSETRON HCL 4 MG/2ML IJ SOLN: 4.0000 mg | Freq: Four times a day (QID) | INTRAMUSCULAR | Status: DC | PRN

## 2019-03-07 MED ORDER — HEPARIN (PORCINE) IN NACL 1000-0.9 UT/500ML-% IV SOLN
INTRAVENOUS | Status: DC | PRN
  Administered 2019-03-07 (×2): 500 mL

## 2019-03-07 MED ORDER — HEPARIN (PORCINE) IN NACL 1000-0.9 UT/500ML-% IV SOLN
INTRAVENOUS | Status: AC
  Filled 2019-03-07: qty 1000

## 2019-03-07 MED ORDER — FENTANYL CITRATE (PF) 100 MCG/2ML IJ SOLN
INTRAMUSCULAR | Status: AC
  Filled 2019-03-07: qty 2

## 2019-03-07 MED ORDER — HEPARIN SODIUM (PORCINE) 1000 UNIT/ML IJ SOLN
INTRAMUSCULAR | Status: AC
  Filled 2019-03-07: qty 1

## 2019-03-07 MED ORDER — LIDOCAINE HCL (PF) 1 % IJ SOLN
INTRAMUSCULAR | Status: DC | PRN
  Administered 2019-03-07 (×2): 2 mL

## 2019-03-07 SURGICAL SUPPLY — 18 items
CATH 5FR JL3.5 JR4 ANG PIG MP (CATHETERS) ×2 IMPLANT
CATH BALLN WEDGE 5F 110CM (CATHETERS) ×2 IMPLANT
CATH INFINITI 5 FR AL2 (CATHETERS) ×2 IMPLANT
CATH INFINITI 5FR AL1 (CATHETERS) ×2 IMPLANT
CATH LAUNCHER 5F RADL (CATHETERS) ×1 IMPLANT
CATH OPTITORQUE TIG 4.0 5F (CATHETERS) ×2 IMPLANT
CATHETER LAUNCHER 5F RADL (CATHETERS) ×2
DEVICE RAD COMP TR BAND LRG (VASCULAR PRODUCTS) ×2 IMPLANT
GLIDESHEATH SLEND SS 6F .021 (SHEATH) ×2 IMPLANT
GUIDEWIRE .025 260CM (WIRE) ×2 IMPLANT
GUIDEWIRE INQWIRE 1.5J.035X260 (WIRE) ×1 IMPLANT
INQWIRE 1.5J .035X260CM (WIRE) ×2
KIT HEART LEFT (KITS) ×2 IMPLANT
PACK CARDIAC CATHETERIZATION (CUSTOM PROCEDURE TRAY) ×2 IMPLANT
SHEATH GLIDE SLENDER 4/5FR (SHEATH) ×2 IMPLANT
SHEATH PROBE COVER 6X72 (BAG) ×2 IMPLANT
TRANSDUCER W/STOPCOCK (MISCELLANEOUS) ×2 IMPLANT
TUBING CIL FLEX 10 FLL-RA (TUBING) ×2 IMPLANT

## 2019-03-07 NOTE — Progress Notes (Signed)
Discharge instructions reviewed with pt wife. Voices understanding.

## 2019-03-07 NOTE — Discharge Instructions (Signed)
Radial Site Care  This sheet gives you information about how to care for yourself after your procedure. Your health care provider may also give you more specific instructions. If you have problems or questions, contact your health care provider. What can I expect after the procedure? After the procedure, it is common to have:  Bruising and tenderness at the catheter insertion area. Follow these instructions at home: Medicines  Take over-the-counter and prescription medicines only as told by your health care provider. Insertion site care  Follow instructions from your health care provider about how to take care of your insertion site. Make sure you: ? Wash your hands with soap and water before you change your bandage (dressing). If soap and water are not available, use hand sanitizer. ? Change your dressing as told by your health care provider. ? Leave stitches (sutures), skin glue, or adhesive strips in place. These skin closures may need to stay in place for 2 weeks or longer. If adhesive strip edges start to loosen and curl up, you may trim the loose edges. Do not remove adhesive strips completely unless your health care provider tells you to do that.  Check your insertion site every day for signs of infection. Check for: ? Redness, swelling, or pain. ? Fluid or blood. ? Pus or a bad smell. ? Warmth.  Do not take baths, swim, or use a hot tub until your health care provider approves.  You may shower 24-48 hours after the procedure, or as directed by your health care provider. ? Remove the dressing and gently wash the site with plain soap and water. ? Pat the area dry with a clean towel. ? Do not rub the site. That could cause bleeding.  Do not apply powder or lotion to the site. Activity   For 24 hours after the procedure, or as directed by your health care provider: ? Do not flex or bend the affected arm. ? Do not push or pull heavy objects with the affected arm. ? Do not  drive yourself home from the hospital or clinic. You may drive 24 hours after the procedure unless your health care provider tells you not to. ? Do not operate machinery or power tools.  Do not lift anything that is heavier than 10 lb (4.5 kg), or the limit that you are told, until your health care provider says that it is safe.  Ask your health care provider when it is okay to: ? Return to work or school. ? Resume usual physical activities or sports. ? Resume sexual activity. General instructions  If the catheter site starts to bleed, raise your arm and put firm pressure on the site. If the bleeding does not stop, get help right away. This is a medical emergency.  If you went home on the same day as your procedure, a responsible adult should be with you for the first 24 hours after you arrive home.  Keep all follow-up visits as told by your health care provider. This is important. Contact a health care provider if:  You have a fever.  You have redness, swelling, or yellow drainage around your insertion site. Get help right away if:  You have unusual pain at the radial site.  The catheter insertion area swells very fast.  The insertion area is bleeding, and the bleeding does not stop when you hold steady pressure on the area.  Your arm or hand becomes pale, cool, tingly, or numb. These symptoms may represent a serious problem   that is an emergency. Do not wait to see if the symptoms will go away. Get medical help right away. Call your local emergency services (911 in the [place] Do not drive yourself to the hospital. Summary  After the procedure, it is common to have bruising and tenderness at the site.  Follow instructions from your health care provider about how to take care of your radial site wound. Check the wound every day for signs of infection.  Do not lift anything that is heavier than 10 lb (4.5 kg), or the limit that you are told, until your health care provider says  that it is safe. This information is not intended to replace advice given to you by your health care provider. Make sure you discuss any questions you have with your health care provider. Document Revised: 02/24/2017 Document Reviewed: 02/24/2017 Elsevier Patient Education  2020 [employer].  

## 2019-03-07 NOTE — Progress Notes (Signed)
Splint applied to right arm

## 2019-03-07 NOTE — Progress Notes (Addendum)
Discharge instructions reviewed with pt voices understanding. Attempted to call pt wife message left for her to return call.

## 2019-03-07 NOTE — Progress Notes (Signed)
  Echocardiogram 2D Echocardiogram has been performed.  Daniel Hanna 03/07/2019, 2:26 PM

## 2019-03-07 NOTE — Progress Notes (Signed)
Instructed several times not to move his right arm, due to the risk of bleeding. Continues to move it.

## 2019-03-07 NOTE — Interval H&P Note (Signed)
History and Physical Interval Note:  03/07/2019 10:42 AM  Daniel Hanna  has presented today for surgery, with the diagnosis of aortic stenosis.  The various methods of treatment have been discussed with the patient and family. After consideration of risks, benefits and other options for treatment, the patient has consented to  Procedure(s): RIGHT/LEFT HEART CATH AND CORONARY ANGIOGRAPHY (N/A) as a surgical intervention.  The patient's history has been reviewed, patient examined, no change in status, stable for surgery.  I have reviewed the patient's chart and labs.  Questions were answered to the patient's satisfaction.     Sherren Mocha

## 2019-03-13 ENCOUNTER — Ambulatory Visit (HOSPITAL_COMMUNITY): Payer: PPO

## 2019-03-15 ENCOUNTER — Ambulatory Visit (HOSPITAL_COMMUNITY)
Admission: RE | Admit: 2019-03-15 | Discharge: 2019-03-15 | Disposition: A | Discharge status: Home / Self Care | Payer: PPO | Source: Ambulatory Visit | Attending: Cardiovascular Disease | Admitting: Cardiovascular Disease

## 2019-03-15 ENCOUNTER — Encounter (HOSPITAL_COMMUNITY): Payer: Self-pay

## 2019-03-15 ENCOUNTER — Other Ambulatory Visit: Payer: Self-pay

## 2019-03-15 DIAGNOSIS — I350 Nonrheumatic aortic (valve) stenosis: Secondary | ICD-10-CM | POA: Diagnosis not present

## 2019-03-15 DIAGNOSIS — Z01818 Encounter for other preprocedural examination: Secondary | ICD-10-CM | POA: Diagnosis not present

## 2019-03-15 MED ORDER — IOHEXOL 350 MG/ML SOLN
100.0000 mL | Freq: Once | INTRAVENOUS | Status: AC | PRN
  Administered 2019-03-15: 100 mL via INTRAVENOUS

## 2019-03-20 ENCOUNTER — Encounter: Payer: Self-pay | Admitting: Physical Therapy

## 2019-03-20 ENCOUNTER — Ambulatory Visit (INDEPENDENT_AMBULATORY_CARE_PROVIDER_SITE_OTHER): Payer: PPO

## 2019-03-20 ENCOUNTER — Ambulatory Visit (INDEPENDENT_AMBULATORY_CARE_PROVIDER_SITE_OTHER): Payer: PPO | Admitting: Vascular Surgery

## 2019-03-20 ENCOUNTER — Encounter (INDEPENDENT_AMBULATORY_CARE_PROVIDER_SITE_OTHER): Payer: Self-pay | Admitting: Vascular Surgery

## 2019-03-20 ENCOUNTER — Encounter: Payer: Self-pay | Admitting: Thoracic Surgery (Cardiothoracic Vascular Surgery)

## 2019-03-20 ENCOUNTER — Institutional Professional Consult (permissible substitution): Payer: PPO | Admitting: Thoracic Surgery (Cardiothoracic Vascular Surgery)

## 2019-03-20 ENCOUNTER — Ambulatory Visit: Payer: PPO | Attending: Cardiovascular Disease | Admitting: Physical Therapy

## 2019-03-20 ENCOUNTER — Other Ambulatory Visit: Payer: Self-pay

## 2019-03-20 ENCOUNTER — Other Ambulatory Visit: Payer: Self-pay | Admitting: *Deleted

## 2019-03-20 VITALS — BP 131/80 | HR 61 | Resp 15 | Wt 198.8 lb

## 2019-03-20 VITALS — BP 157/79 | HR 58 | Temp 97.9°F | Resp 20 | Ht 71.0 in | Wt 198.5 lb

## 2019-03-20 DIAGNOSIS — I2510 Atherosclerotic heart disease of native coronary artery without angina pectoris: Secondary | ICD-10-CM

## 2019-03-20 DIAGNOSIS — E782 Mixed hyperlipidemia: Secondary | ICD-10-CM

## 2019-03-20 DIAGNOSIS — I6521 Occlusion and stenosis of right carotid artery: Secondary | ICD-10-CM

## 2019-03-20 DIAGNOSIS — I10 Essential (primary) hypertension: Secondary | ICD-10-CM | POA: Diagnosis not present

## 2019-03-20 DIAGNOSIS — I350 Nonrheumatic aortic (valve) stenosis: Secondary | ICD-10-CM

## 2019-03-20 DIAGNOSIS — R262 Difficulty in walking, not elsewhere classified: Secondary | ICD-10-CM | POA: Insufficient documentation

## 2019-03-20 NOTE — Patient Instructions (Signed)
Stop taking Plavix  Continue taking all other medications without change through the day before surgery.  Make sure to bring all of your medications with you when you come for your Pre-Admission Testing appointment at [facility] Short-Stay Department.  Have nothing to eat or drink after midnight the night before surgery.  On the morning of surgery take only carvedilol and amlodipine with a sip of water.  At your appointment for Pre-Admission Testing at the [facility] Short-Stay Department you will be asked to sign permission forms for your upcoming surgery.  By definition your signature on these forms implies that you and/or your designee provide full informed consent for your planned surgical procedure(s), that alternative treatment options have been discussed, that you understand and accept any and all potential risks, and that you have some understanding of what to expect for your post-operative convalescence.  For any major cardiac surgical procedure potential operative risks include but are not limited to at least some risk of death, stroke or other neurologic complication, myocardial infarction, congestive heart failure, respiratory failure, renal failure, bleeding requiring blood transfusion and/or reexploration, irregular heart rhythm, heart block or bradycardia requiring permanent pacemaker, pneumonia, pericardial effusion, pleural effusion, wound infection, pulmonary embolus or other thromboembolic complication, chronic pain, or other complications related to the specific procedure(s) performed.  Please call to schedule a follow-up appointment in our office prior to surgery if you have any unresolved questions about your planned surgical procedure, the associated risks, alternative treatment options, and/or expectations for your post-operative recovery.

## 2019-03-20 NOTE — Progress Notes (Signed)
[facility] [place] [facility]  CARDIOTHORACIC SURGERY CONSULTATION REPORT  Referring Provider is Rusty Aus, MD Primary Cardiologist is Lorretta Harp, MD PCP is Rusty Aus, MD  Chief Complaint  Patient presents with  . Aortic Stenosis    TAVR consult    HPI:  Patient is an 79 year old male with history of aortic stenosis, coronary artery disease status post acute myocardial infarction in 2004 treated with multivessel PCI and stenting, hypertension, hyperlipidemia, left bundle branch block, asymptomatic carotid artery stenosis, and prostate cancer who has been referred for surgical consultation to discuss treatment options for management of severe aortic stenosis and multivessel coronary artery disease.  Patient's cardiac history dates back to 2004 when he suffered an acute myocardial infarction.  He was treated at [facility] with multivessel PCI and stenting involving the left anterior descending coronary artery and the left circumflex coronary artery.  The patient has been followed intermittently by Dr. Gwenlyn Found who saw him most recently in March 2019 and by Dr. Sabra Heck who has been following him on a regular basis.  Echocardiogram performed April 13, 2017 revealed moderate left ventricular systolic dysfunction with ejection fraction estimated 35 to 40%.  There was felt to be moderate aortic stenosis with peak velocity across aortic valve measured 3.2 m/s corresponding to mean transvalvular gradient estimated 25 mmHg.  Recent follow-up transthoracic echocardiogram performed at the [facility] clinic revealed significant progression and severity of aortic stenosis with peak velocity across the aortic valve measured 4.6 m/s corresponding to mean transvalvular gradient estimated 45 mmHg.  The patient was referred to the multidisciplinary heart valve clinic and has been evaluated previously by Dr. Burt Knack.  Transthoracic echocardiogram  performed March 07, 2019 revealed moderate left ventricular systolic dysfunction with ejection fraction estimated 40 to 45%.  There was inferolateral akinesis.  The aortic valve was trileaflet with severe thickening and calcification of all 3 leaflets of the aortic valve.  Peak velocity across aortic valve measured 4.1 m/s corresponding to mean transvalvular gradient estimated 39 mmHg.  There was mild aortic insufficiency.  Diagnostic cardiac catheterization performed March 07, 2019 confirmed the presence of aortic stenosis and revealed severe multivessel coronary artery disease with high-grade proximal stenosis of left anterior descending coronary artery, the ramus intermediate branch, and subtotal occlusion of the left circumflex.  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  Right heart pressures were normal.  CT angiography was performed and the patient has been referred for surgical consultation.  Patient is married and lives locally in [place] with his wife.  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  He enjoys fishing and Publishing rights manager.  He remains reasonably active for a gentleman his age and reports no significant physical limitations.  He specifically denies any significant history of symptoms of exertional shortness of breath or chest discomfort.  He does not exercise on a regular basis but he does not feel as though he has been slowing down physically to any significant degree.  He still drives an automobile and tends to chores around the house without significant problems.  He has been hunting this season.  He does report occasional mild dizzy spells without any history of syncope.  He also complains of some mild memory disturbance with tendency to get forgetful.  He states that this is not become a big problem but  it has become "annoying".  He  states that on 1 or 2 occasions he has been out driving an automobile and forgot where he was headed.  He has not gotten lost.  He states that he has become to rely on his wife to answer questions regarding his medical history.  He states that his mobility is normal and he has not had problems with poor balance or any difficulty with walking.  The patient has received his first COVID-19 vaccine injection and is scheduled to receive his second injection this week.   Past Medical History:  Diagnosis Date  . Arthritis    fingers  . Coronary artery disease CARDIOLOGIST--   DR BERRY--  LOV NOTE 01-29-2012  W/ CHART  . ED (erectile dysfunction)   . History of coronary artery stent placement    STENTS X2  TO LAD AND CIRCUMFLEX  . History of elevated PSA 2003   PSA=0.95  . History of myocardial infarction DEC 2004   S/P STENT X2  AT [facility]  . Hypercholesterolemia   . Hypertension   . LBBB (left bundle branch block)   . Prostate cancer ([place]) 02/24/12   BX=Adenocarcinoma,Gleason 3+3=6,3+4=7 PSA=5.80    Past Surgical History:  Procedure Laterality Date  . CARDIAC CATHETERIZATION  03-02-2006  DR BERRY   PATENT STENTS/ 30-40% TAPERED DISTAL LEFT MAIN AND 50-60% AV GROOVE CIRCUMFLEX PRIOR TO STENT  &  50% LESION IN MID PORTION OF LAD.  Marland Kitchen CARDIOVASCULAR STRESS TEST  05-30-2010   DR BERRY   LOW RISK SCAN/  MODERATE SIZED INEROLATERAL SCAR WITHOUT ISCHEMIA / ISCHEMIA/ NO SIGNIFICANT CHANGE FROM PREVIOUS STUDY/   . COLONOSCOPY WITH PROPOFOL N/A 09/23/2015   Procedure: COLONOSCOPY WITH PROPOFOL;  Surgeon: Manya Silvas, MD;  Location: [facility] ENDOSCOPY;  Service: Endoscopy;  Laterality: N/A;  . CORONARY ANGIOPLASTY WITH STENT PLACEMENT  DEC 2004  AT [facility]   STENTING OF CIRCUMFLEX AND LAD  . ESOPHAGOGASTRODUODENOSCOPY (EGD) WITH PROPOFOL N/A 09/23/2015   Procedure: ESOPHAGOGASTRODUODENOSCOPY (EGD) WITH PROPOFOL;  Surgeon: Manya Silvas, MD;  Location: [facility] ENDOSCOPY;  Service: Endoscopy;  Laterality:  N/A;  . HEMORRHOID SURGERY    . INGUINAL HERNIA REPAIR  1980'S  . PROSTATE BIOPSY  02/29/12   ADENOCARCINOMA  . RADIOACTIVE SEED IMPLANT N/A 05/12/2012   Procedure: RADIOACTIVE SEED IMPLANT;  Surgeon: Franchot Gallo, MD;  Location: [facility];  Service: Urology;  Laterality: N/A;  DR PORTABLE/RAD TECH NEEDED  . RIGHT/LEFT HEART CATH AND CORONARY ANGIOGRAPHY N/A 03/07/2019   Procedure: RIGHT/LEFT HEART CATH AND CORONARY ANGIOGRAPHY;  Surgeon: Sherren Mocha, MD;  Location: [place] CV LAB;  Service: Cardiovascular;  Laterality: N/A;  . TRANSTHORACIC ECHOCARDIOGRAM  05-30-2010   MILD CONCENTRIC LVH/ EF [id number] STAGE I DIASTOLIC DYSFUCTION/ MILD MR, TR, & AR/ MILD SCLEROTIC AORTIC VALVE WITHOUT STENOSIS    Family History  Problem Relation Age of Onset  . Diabetes Mother   . Stroke Mother   . Cancer Sister 91       unknown    Social History   Socioeconomic History  . Marital status: Married    Spouse name: Not on file  . Number of children: Not on file  . Years of education: Not on file  . Highest education level: Not on file  Occupational History  . Not on file  Tobacco Use  . Smoking status: Former Smoker    Packs/day: 1.00    Years: 25.00    Pack years: 25.00    Quit date:  05/06/2001    Years since quitting: 17.8  . Smokeless tobacco: Never Used  Substance and Sexual Activity  . Alcohol use: No  . Drug use: No  . Sexual activity: Not on file  Other Topics Concern  . Not on file  Social History Narrative  . Not on file   Social Determinants of Health   Financial Resource Strain:   . Difficulty of Paying Living Expenses: Not on file  Food Insecurity:   . Worried About Charity fundraiser in the Last Year: Not on file  . Ran Out of Food in the Last Year: Not on file  Transportation Needs:   . Lack of Transportation (Medical): Not on file  . Lack of Transportation (Non-Medical): Not on file  Physical Activity:   . Days of Exercise per Week: Not on  file  . Minutes of Exercise per Session: Not on file  Stress:   . Feeling of Stress : Not on file  Social Connections:   . Frequency of Communication with Friends and Family: Not on file  . Frequency of Social Gatherings with Friends and Family: Not on file  . Attends Religious Services: Not on file  . Active Member of Clubs or Organizations: Not on file  . Attends Archivist Meetings: Not on file  . Marital Status: Not on file  Intimate Partner Violence:   . Fear of Current or Ex-Partner: Not on file  . Emotionally Abused: Not on file  . Physically Abused: Not on file  . Sexually Abused: Not on file    Current Outpatient Medications  Medication Sig Dispense Refill  . amLODipine (NORVASC) 2.5 MG tablet Take 2.5 mg by mouth daily.     Marland Kitchen aspirin 81 MG tablet Take 81 mg by mouth daily.    . carvedilol (COREG) 3.125 MG tablet Take 3.125 mg by mouth daily.     . clopidogrel (PLAVIX) 75 MG tablet Take 75 mg by mouth daily.    . hydrOXYzine (ATARAX/VISTARIL) 25 MG tablet Take 25 mg by mouth daily as needed for itching.    Marland Kitchen lisinopril (ZESTRIL) 10 MG tablet Take 10 mg by mouth daily.    . pravastatin (PRAVACHOL) 20 MG tablet Take 20 mg by mouth daily.      No current facility-administered medications for this visit.    Allergies  Allergen Reactions  . Codeine Other (See Comments)    Deriviatives, dysfunctional ,woozy,spinning,dizzy      Review of Systems:   General:  normal appetite, normal energy, no weight gain, no weight loss, no fever  Cardiac:  no chest pain with exertion, no chest pain at rest, no SOB with exertion, no resting SOB, no PND, no orthopnea, no palpitations, no arrhythmia, no atrial fibrillation, no LE edema, + dizzy spells, no syncope  Respiratory:  no shortness of breath, no home oxygen, no productive cough, no dry cough, no bronchitis, no wheezing, no hemoptysis, no asthma, no pain with inspiration or cough, no sleep apnea, no CPAP at  night  GI:   no difficulty swallowing, no reflux, no frequent heartburn, no hiatal hernia, no abdominal pain, no constipation, no diarrhea, no hematochezia, no hematemesis, no melena  GU:   no dysuria,  no frequency, no urinary tract infection, no hematuria, no enlarged prostate, no kidney stones, no kidney disease  Vascular:  no pain suggestive of claudication, no pain in feet, no leg cramps, no varicose veins, no DVT, no non-healing foot ulcer  Neuro:   no  stroke, no TIA's, no seizures, no headaches, no temporary blindness one eye,  no slurred speech, no peripheral neuropathy, no chronic pain, no instability of gait, + mild memory/cognitive dysfunction  Musculoskeletal: no arthritis, no joint swelling, no myalgias, no difficulty walking, normal mobility   Skin:   no rash, no itching, no skin infections, no pressure sores or ulcerations  Psych:   no anxiety, no depression, no nervousness, no unusual recent stress  Eyes:   no blurry vision, no floaters, no recent vision changes, does not wear glasses or contacts  ENT:   no hearing loss, no loose or painful teeth, full set dentures, edentulous  Hematologic:  + easy bruising, no abnormal bleeding, no clotting disorder, no frequent epistaxis  Endocrine:  no diabetes, does not check CBG's at home           Physical Exam:   BP (!) 157/79 (BP Location: Right Arm, Patient Position: Sitting, Cuff Size: Normal)   Pulse (!) 58   Temp 97.9 F (36.6 C) (Skin)   Resp 20   Ht 5\' 11"  (1.803 m)   Wt 198 lb 8 oz (90 kg)   SpO2 94% Comment: RA  BMI 27.69 kg/m   General:  Elderly,  well-appearing  HEENT:  Unremarkable   Neck:   no JVD, no bruits, no adenopathy   Chest:   clear to auscultation, symmetrical breath sounds, no wheezes, no rhonchi   CV:   RRR, grade III/VI crescendo/decrescendo murmur heard best at RSB,  no diastolic murmur  Abdomen:  soft, non-tender, no masses   Extremities:  warm, well-perfused, pulses diminished but palpable, no LE  edema  Rectal/GU  Deferred  Neuro:   Grossly non-focal and symmetrical throughout  Skin:   Clean and dry, no rashes, no breakdown   Diagnostic Tests:  ECHOCARDIOGRAM REPORT       Patient Name:  KRU NOVICK Date of Exam: 03/07/2019  Medical Rec #: [id number]  Height:    71.0 in  Accession #:  [id number]  Weight:    198.0 lb  Date of Birth: 07-Oct-1940  BSA:     2.10 m  Patient Age:  34 years   BP:      150/65 mmHg  Patient Gender: M      HR:      58 bpm.  Exam Location: Inpatient   Procedure: 2D Echo, Cardiac Doppler and Color Doppler   Indications:  Aortic stenosis    History:    Patient has prior history of Echocardiogram examinations,  most         recent 04/13/2017. CAD, Aortic Valve Disease,  Arrythmias:LBBB;         Risk Factors:Hypertension and Dyslipidemia.    Sonographer:  Dustin Flock  Referring Phys: [place]    1. Left ventricular ejection fraction, by visual estimation, is 40 to  45%. The left ventricle has mild to moderately decreased function. There  is no left ventricular hypertrophy.  2. The left ventricle demonstrates regional wall motion abnormalities  with inferolateral akinesis .  3. Elevated left atrial and left ventricular end-diastolic pressures.  4. Left ventricular diastolic parameters are consistent with Grade I  diastolic dysfunction (impaired relaxation).  5. Global right ventricle has normal systolic function.The right  ventricular size is normal. No increase in right ventricular wall  thickness.  6. Left atrial size was moderately dilated.  7. Right atrial size was normal.  8. Moderate to severe mitral annular calcification. Mild mitral  valve  regurgitation. No evidence of mitral stenosis.  9. The tricuspid valve is normal in structure. Tricuspid valve  regurgitation is not demonstrated.  10. The aortic valve is tricuspid. .  There is severe thickening and severe  calcifcation of the aortic valve. Aortic valve regurgitation is mild.  Aortic regurgitation PHT measures 590 msec. Severe aortic stenosis is  present. Severe aortic valve annular  calcification. There is severe thickening of the aortic valve. There is  severe calcifcation of the aortic valve. Aortic valve mean gradient  measures 39.0 mmHg. Aortic valve peak gradient measures 68.6 mmHg. Aortic  valve area, by VTI measures 1.14 cm.  11. The pulmonic valve was normal in structure. Pulmonic valve  regurgitation is trivial.  12. The inferior vena cava is normal in size with greater than 50%  respiratory variability, suggesting right atrial pressure of 3 mmHg.  13. TR signal is inadequate for assessing pulmonary artery systolic  pressure.   FINDINGS  Left Ventricle: Left ventricular ejection fraction, by visual estimation,  is 40 to 45%. The left ventricle has mild to moderately decreased  function. There is no left ventricular hypertrophy. Left ventricular  diastolic parameters are consistent with  Grade I diastolic dysfunction (impaired relaxation). Elevated left atrial  and left ventricular end-diastolic pressures.    Right Ventricle: The right ventricular size is normal. No increase in  right ventricular wall thickness. Global RV systolic function is has  normal systolic function.   Left Atrium: Left atrial size was moderately dilated.   Right Atrium: Right atrial size was normal in size   Pericardium: There is no evidence of pericardial effusion.   Mitral Valve: The mitral valve is normal in structure. Moderate to severe  mitral annular calcification. Mild mitral valve regurgitation. No evidence  of mitral valve stenosis by observation.   Tricuspid Valve: The tricuspid valve is normal in structure. Tricuspid  valve regurgitation is not demonstrated.   Aortic Valve: The aortic valve is tricuspid. . There is severe thickening  and severe  calcifcation of the aortic valve. Aortic valve regurgitation is  mild. Aortic regurgitation PHT measures 590 msec. Severe aortic stenosis  is present. Severe aortic valve  annular calcification. There is severe thickening of the aortic valve.  There is severe calcifcation of the aortic valve. Aortic valve mean  gradient measures 39.0 mmHg. Aortic valve peak gradient measures 68.6  mmHg. Aortic valve area, by VTI measures 1.14  cm.   Pulmonic Valve: The pulmonic valve was normal in structure. Pulmonic valve  regurgitation is trivial. Pulmonic regurgitation is trivial.   Aorta: The aortic root, ascending aorta and aortic arch are all  structurally normal, with no evidence of dilitation or obstruction.   Venous: The inferior vena cava is normal in size with greater than 50%  respiratory variability, suggesting right atrial pressure of 3 mmHg.   IAS/Shunts: No atrial level shunt detected by color flow Doppler. There is  no evidence of a patent foramen ovale. No ventricular septal defect is  seen or detected. There is no evidence of an atrial septal defect.     LEFT VENTRICLE  PLAX 2D  LVIDd:     5.30 cm Diastology  LVIDs:     4.70 cm LV e' lateral:  5.00 cm/s  LV PW:     1.10 cm LV E/e' lateral: 24.0  LV IVS:    1.10 cm LV e' medial:  5.77 cm/s  LVOT diam:   2.00 cm LV E/e' medial: 20.8  LV SV:  33 ml  LV SV Index:  15.44  LVOT Area:   3.14 cm     RIGHT VENTRICLE  RV Basal diam: 2.80 cm  RV S prime:   13.40 cm/s  TAPSE (M-mode): 2.7 cm   LEFT ATRIUM       Index    RIGHT ATRIUM      Index  LA diam:    4.70 cm 2.24 cm/m RA Area:   19.40 cm  LA Vol (A2C):  88.6 ml 42.20 ml/m RA Volume:  57.20 ml 27.24 ml/m  LA Vol (A4C):  101.0 ml 48.11 ml/m  LA Biplane Vol: 95.2 ml 45.34 ml/m  AORTIC VALVE  AV Area (Vmax):  1.05 cm  AV Area (Vmean):  1.03 cm  AV Area (VTI):   1.14 cm  AV Vmax:       414.00 cm/s  AV Vmean:     302.000 cm/s  AV VTI:      1.030 m  AV Peak Grad:   68.6 mmHg  AV Mean Grad:   39.0 mmHg  LVOT Vmax:     139.00 cm/s  LVOT Vmean:    98.900 cm/s  LVOT VTI:     0.373 m  LVOT/AV VTI ratio: 0.36  AI PHT:      590 msec    AORTA  Ao Root diam: 3.10 cm   MITRAL VALVE  MV Area (PHT): 2.32 cm       SHUNTS  MV PHT:    94.83 msec      Systemic VTI: 0.37 m  MV Decel Time: 327 msec       Systemic Diam: 2.00 cm  MV E velocity: 120.00 cm/s 103 cm/s  MV A velocity: 162.00 cm/s 70.3 cm/s  MV E/A ratio: 0.74    1.5     Fransico Him MD  Electronically signed by Fransico Him MD  Signature Date/Time: 03/07/2019/4:30:10 PM       RIGHT/LEFT HEART CATH AND CORONARY ANGIOGRAPHY  Conclusion  1. Severe multivessel CAD with severe proximal LAD stenosis, severe ramus stenosis, and severe subtotal occlusion of the left circumflex 2. 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 Moderate aortic stenosis with peak to peak gradient of 32 mmHg and mean gradient 17 mmHg  Plan: repeat echo to confirm severe AS (echo mean gradient reported >40 mmHg), multidisciplinary review of treatment options (CABG/AVR, PCI/TAVR, med Rx)  Indications  Severe aortic stenosis [I35.0 (ICD-10-CM)]  Procedural Details  Technical Details INDICATION: 79 year old gentleman with severe aortic stenosis, presenting for right and left heart catheterization in preparation for TAVR.  PROCEDURAL DETAILS: There was an indwelling IV in a right antecubital vein. Using normal sterile technique, the IV was changed out for a 5 Fr brachial sheath over a 0.018 inch wire. The right wrist was then prepped, draped, and anesthetized with 1% lidocaine. Using the modified Seldinger technique a 5/6 French Slender sheath was placed in the right radial artery. Intra-arterial verapamil  was administered through the radial artery sheath. IV heparin was administered after a JR4 catheter was advanced into the central aorta. A Swan-Ganz catheter was used for the right heart catheterization. Standard protocol was followed for recording of right heart pressures and sampling of oxygen saturations. Fick cardiac output was calculated. Standard Judkins catheters were used for selective coronary angiography.  The RCA is difficult to cannulate.  The RCA origin is anomalous and it arises just above the left mainstem in  the left cusp.  LV pressure is recorded and an aortic valve pullback is performed. There were no immediate procedural complications. The patient was transferred to the post catheterization recovery area for further monitoring.    Estimated blood loss <50 mL.   During this procedure medications were administered to achieve and maintain moderate conscious sedation while the patient's heart rate, blood pressure, and oxygen saturation were continuously monitored and I was present face-to-face 100% of this time.   Sedation Time Physician-1: 47 minutes 47 seconds  Contrast  Medication Name Total Dose  iohexol (OMNIPAQUE) 350 MG/ML injection 120 mL    Radiation/Fluoro  Fluoro time: 15.5 (min) DAP: [id number] (mGycm2) Cumulative Air Kerma: [id number] (mGy)  Coronary Findings  Diagnostic Dominance: Right Left Anterior Descending  Prox LAD lesion 95% stenosed  Prox LAD lesion is 95% stenosed. The lesion is moderately calcified. Severe eccentric LAD stenosis in the proximal vessel  Ramus Intermedius  Ramus lesion 99% stenosed  Ramus lesion is 99% stenosed.  Left Circumflex  Prox Cx to Mid Cx lesion 95% stenosed  Prox Cx to Mid Cx lesion is 95% stenosed. The lesion is severely calcified.  Mid Cx to Dist Cx lesion 100% stenosed  Mid Cx to Dist Cx lesion is 100% stenosed. The lesion is severely calcified. distal vessel fills late  Third Obtuse Marginal Branch  Collaterals  3rd Mrg  filled by collaterals from 2nd Sept.    Right Coronary Artery  There is mild diffuse disease throughout the vessel. Severe diffuse calcification without high-grade stenosis  Ost RCA to Prox RCA lesion 50% stenosed  Ost RCA to Prox RCA lesion is 50% stenosed. The lesion is calcified. anomalous origin above the left main. patent, dominant vessel with diffuse calcification, moderate ostial stenosis.  Intervention  No interventions have been documented. Coronary Diagrams  Diagnostic Dominance: Right  Intervention  Implants   No implant documentation for this case.  Syngo Images  Show images for CARDIAC CATHETERIZATION  Images on Long Term Storage  Show images for Iyad, Mcvoy to Procedure Log  Procedure Log    Hemo Data   Most Recent Value  Fick Cardiac Output 8.8 L/min  Fick Cardiac Output Index 4.19 (L/min)/BSA  Aortic Mean Gradient 16.6 mmHg  Aortic Peak Gradient 32 mmHg  Aortic Valve Area 2.41  Aortic Value Area Index 1.15 cm2/BSA  RA A Wave 8 mmHg  RA V Wave 3 mmHg  RA Mean 1 mmHg  RV Systolic Pressure 31 mmHg  RV Diastolic Pressure -3 mmHg  RV EDP 1 mmHg  PA Systolic Pressure 33 mmHg  PA Diastolic Pressure 8 mmHg  PA Mean 17 mmHg  PW A Wave 16 mmHg  PW V Wave 14 mmHg  PW Mean 10 mmHg  AO Systolic Pressure [id number] mmHg  AO Diastolic Pressure 50 mmHg  AO Mean 83 mmHg  LV Systolic Pressure [id number] mmHg  LV Diastolic Pressure 3 mmHg  LV EDP 10 mmHg  AOp Systolic Pressure 0000000 mmHg  AOp Diastolic Pressure 55 mmHg  AOp Mean Pressure 92 mmHg  LVp Systolic Pressure 99991111 mmHg  LVp Diastolic Pressure 0 mmHg  LVp EDP Pressure 8 mmHg  QP/QS 0.76  TPVR Index 4.06 HRUI  TSVR Index 19.82 HRUI  PVR SVR Ratio 0.09  TPVR/TSVR Ratio 0.2     Cardiac TAVR CT  TECHNIQUE: The patient was scanned on a Siemens Force [id number] slice scanner. A 120 kV retrospective scan was triggered in the descending thoracic aorta at 111 HU's.  Gantry rotation speed was 270 msecs and  collimation was .9 mm. No beta blockade or nitro were given. The 3D data set was reconstructed in 5% intervals of the R-R cycle. Systolic and diastolic phases were analyzed on a dedicated work station using MPR, MIP and VRT modes. The patient received 170mL OMNIPAQUE IOHEXOL 350 MG/ML SOLN of contrast.  FINDINGS: Aortic Valve: Trileaflet aortic valve. Severely reduced cusp separation. Moderately thickened, severely calcified aortic valve cusps.  AV calcium score: 2427  Virtual Basal Annulus Measurements:  Maximum/Minimum Diameter: 24.3 x 21.3 mm  Perimeter: 72 mm  Area: 404 mm2  LVOT calcifications present, mild.  Based on these measurements, the annulus would be suitable for a 23 mm Sapien 3 valve.  Sinus of Valsalva Measurements:  Non-coronary: 32 mm  Right - coronary: 30 mm  Left - coronary: 32 mm  Sinus of Valsalva Height:  Left: 24.3 mm  Right:19.9 mm  Aorta: Conventional 3 vessel branch pattern of aortic arch. Severe mixed atherosclerotic plaque in the aortic arch and descending thoracic aorta.  Sinotubular Junction: 28 mm  Ascending Thoracic Aorta: 36 mm  Aortic Arch: 27 mm  Descending Thoracic Aorta: 25 mm  Coronary Artery Height above Annulus:  Left Main: 19.6 mm  Right Coronary off left cusp: 23.6 mm  Coronary Arteries: Anomalous origin of the RCA off the left cusp.  Optimum Fluoroscopic Angle for Delivery: RAO 2 CRA 3  No left atrial appendage thrombus.  IMPRESSION: 1. Trileaflet aortic valve. Severely reduced cusp separation. Moderately thickened, severely calcified aortic valve cusps.  2.  AV calcium score 2427  3.  Annulus area 404 mm2, suitable for 23 mm Sapien 3 valve.  4.  Mild LVOT calcification present.  5. Normal caliber thoracic aorta. Severe mixed atherosclerotic plaque in the arch and descending thoracic aorta.  6. Optimum Fluoroscopic Angle for Delivery: RAO 2 CRA 3  7. 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 Adequate coronary ostial height.   Electronically Signed   By: Cherlynn Kaiser   On: 03/17/2019 06:03    CT ANGIOGRAPHY CHEST, ABDOMEN AND PELVIS  TECHNIQUE: Multidetector CT imaging through the chest, abdomen and pelvis was performed using the standard protocol during bolus administration of intravenous contrast. Multiplanar reconstructed images and MIPs were obtained and reviewed to evaluate the vascular anatomy.  CONTRAST:  121mL OMNIPAQUE IOHEXOL 350 MG/ML SOLN  COMPARISON:  01/04/2014 chest CT. 06/10/1999 CT abdomen/pelvis.  FINDINGS: CTA CHEST FINDINGS  Cardiovascular: Mild cardiomegaly. Diffuse thickening and coarse calcification of the aortic valve. No significant pericardial effusion/thickening. Three-vessel coronary atherosclerosis. Atherosclerotic nonaneurysmal thoracic aorta. Aortic arch branch vessels are patent. Normal caliber main pulmonary artery. No central pulmonary emboli.  Mediastinum/Nodes: No discrete thyroid nodules. Unremarkable esophagus. No pathologically enlarged axillary, mediastinal or hilar lymph nodes.  Lungs/Pleura: No pneumothorax. Small calcified bilateral pleural plaques. No pleural effusions. No central airway stenoses. Moderate compressive atelectasis in the medial left lower lobe from the hiatal hernia. Bandlike pleural-parenchymal scarring in dependent right lower lobe is new. No lung masses or significant pulmonary nodules.  Musculoskeletal: No aggressive appearing focal osseous lesions. Mild thoracic spondylosis.  CTA ABDOMEN AND PELVIS FINDINGS  Hepatobiliary: Slight liver surface irregularity, cannot exclude cirrhosis. No liver masses. Cholelithiasis. No biliary ductal dilatation.  Pancreas: Normal, with no mass or duct dilation.  Spleen: Normal size. No mass.  Adrenals/Urinary Tract: Normal adrenals. No hydronephrosis. No contour deforming renal masses. Normal  bladder.  Stomach/Bowel: Large hiatal hernia. Otherwise normal nondistended stomach. Normal caliber small bowel with  no small bowel wall thickening. Normal appendix. Marked diffuse colonic diverticulosis, with no large bowel wall thickening or significant pericolonic fat stranding.  Vascular/Lymphatic: Atherosclerotic abdominal aorta with 3.0 cm infrarenal abdominal aortic aneurysm. No pathologically enlarged lymph nodes in the abdomen or pelvis.  Reproductive: Top-normal size prostate. Brachytherapy seeds throughout the prostate.  Other: No pneumoperitoneum, ascites or focal fluid collection.  Musculoskeletal: No aggressive appearing focal osseous lesions. Moderate lower lumbar spondylosis.  VASCULAR MEASUREMENTS PERTINENT TO TAVR:  AORTA:  Minimal Aortic Diameter-11.4 x 10.7 mm  Severity of Aortic Calcification-severe  RIGHT PELVIS:  Right Common Iliac Artery -  Minimal Diameter-7.5 x 7.3 mm  Tortuosity-mild  Calcification-severe  Right External Iliac Artery -  Minimal Diameter-8.6 x 6.8 mm  Tortuosity-severe  Calcification-mild  Right Common Femoral Artery -  Minimal Diameter-7.7 x 7.0 mm  Tortuosity-mild  Calcification-moderate to severe  LEFT PELVIS:  Left Common Iliac Artery -  Minimal Diameter-8.4 x 7.6 mm  Tortuosity-mild  Calcification-severe  Left External Iliac Artery -  Minimal Diameter-7.3 x 6.3 mm  Tortuosity-severe  Calcification-mild  Left Common Femoral Artery -  Minimal Diameter-8.1 x 3.7 mm  Tortuosity-mild  Calcification-severe  Review of the MIP images confirms the above findings.  IMPRESSION: 1. Vascular findings and measurements pertinent to potential TAVR procedure, as detailed. 2. Diffuse thickening and coarse calcification of the aortic valve, compatible with the reported history of aortic stenosis. 3. Mild cardiomegaly. Three-vessel coronary atherosclerosis. 4. Large  hiatal hernia. 5. Slight liver surface irregularity, cannot exclude cirrhosis. No liver masses. 6. Cholelithiasis. 7. Marked diffuse colonic diverticulosis. 8. 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 No pleural effusions. Pleural-parenchymal scarring in the dependent right lung base. 9. 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 Recommend follow-up aortic ultrasound in 3 years. This recommendation follows ACR consensus guidelines: White Paper of the ACR Incidental Findings Committee II on Vascular Findings. J Am Coll Radiol 2013; 10:789-794. 10.  Aortic Atherosclerosis (ICD10-I70.0).   Electronically Signed   By: Ilona Sorrel M.D.   On: 03/15/2019 13:16    Impression:  Patient has severe multivessel coronary artery disease including critical proximal stenosis of the left anterior descending coronary artery without angina pectoris and at least stage C, possibly stage D severe aortic stenosis.  The patient denies ongoing symptoms of exertional shortness of breath or chest discomfort.  He does admit to some intermittent dizzy spells without syncope.    I have personally reviewed the patient's recent transthoracic echocardiogram, diagnostic cardiac catheterization, and CT angiograms.  Transthoracic echocardiogram demonstrates moderate global left ventricular systolic dysfunction and severe aortic stenosis.  Left ventricular ejection fraction is estimated approximately 40%.  The aortic valve is trileaflet with severe thickening, calcification, and restricted leaflet mobility involving all 3 leaflets.  Peak velocity across aortic valve measured greater than 4.1 m/s.  There is mild aortic insufficiency.  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  Under the circumstances the patient would probably best be treated with combined aortic valve replacement and multivessel coronary artery bypass grafting.  PCI and stenting of the left anterior descending coronary artery with or without stenting of the ramus intermediate branch followed by transcatheter aortic valve replacement could be considered  as a less invasive treatment option but associated with the potential for suboptimal long-term outcome with increased risk of ischemic heart disease.  Risks associated with conventional surgical aortic valve replacement and coronary artery bypass grafting would be somewhat elevated because of the patient's advanced age.  I suspect the patient would likely do acceptably well with conventional surgery although I am concerned by his reported history of problems with short-term memory and the associated potential for further cognitive decline following open surgery.  Continued medical therapy remains an option although the patient would be at significant risk for ischemic events in the near future given the severity of coronary artery disease.   Plan:  The patient and his wife were counseled at length regarding treatment alternatives for management of severe aortic stenosis and coronary artery disease. Alternative approaches such as conventional aortic valve replacement with coronary artery bypass grafting, transcatheter aortic valve replacement with PCI and stenting, and continued medical therapy without intervention were compared and contrasted at length.  The risks associated with conventional surgical aortic valve replacement and coronary artery bypass graftingwere discussed in detail, as were expectations for post-operative convalescence, and concerns related to the patient's  reported history of memory loss.  Issues specific to transcatheter aortic valve replacement with PCI were discussed including questions about incomplete coronary revascularization and/or recurrent ischemic events, long term valve durability, the potential for paravalvular leak, possible increased risk of need for permanent pacemaker placement, and other technical complications related to the procedures themselves.  Long-term prognosis with medical therapy was discussed. This discussion was placed in the context of the patient's own specific clinical presentation and past medical history.  All of their questions have been addressed.  The patient desires to proceed with conventional surgical aortic valve replacement with coronary artery bypass grafting as the most definitive treatment option for managing all of his problems with hopes to achieve the best possible long-term prognosis.  We plan to proceed with surgery on April 05, 2011.  He will return to our office for follow-up prior to surgery on April 03, 2011.    I spent in excess of 90 minutes during the conduct of this office consultation and >50% of this time involved direct face-to-face encounter with the patient for counseling and/or coordination of their care.      Valentina Gu. Roxy Manns, MD 03/20/2019 12:49 PM

## 2019-03-20 NOTE — Therapy (Signed)
[place], [place], [address] Phone: [phone]   Fax:  [phone]  Physical Therapy Evaluation  Patient Details  Name: Daniel Hanna MRN: [id number] Date of Birth: October 21, 1940 Referring Provider (PT): Sherren Mocha, MD   Encounter Date: 03/20/2019  PT End of Session - 03/20/19 1448    Visit Number  1    PT Start Time  [id number]    PT Stop Time  1445    PT Time Calculation (min)  30 min    Activity Tolerance  Patient tolerated treatment well    Behavior During Therapy  [facility] for tasks assessed/performed       Past Medical History:  Diagnosis Date  . Arthritis    fingers  . Coronary artery disease CARDIOLOGIST--   DR BERRY--  LOV NOTE 01-29-2012  W/ CHART  . ED (erectile dysfunction)   . History of coronary artery stent placement    STENTS X2  TO LAD AND CIRCUMFLEX  . History of elevated PSA 2003   PSA=0.95  . History of myocardial infarction DEC 2004   S/P STENT X2  AT [facility]  . Hypercholesterolemia   . Hypertension   . LBBB (left bundle branch block)   . Prostate cancer ([place]) 02/24/12   BX=Adenocarcinoma,Gleason 3+3=6,3+4=7 PSA=5.80    Past Surgical History:  Procedure Laterality Date  . CARDIAC CATHETERIZATION  03-02-2006  DR BERRY   PATENT STENTS/ 30-40% TAPERED DISTAL LEFT MAIN AND 50-60% AV GROOVE CIRCUMFLEX PRIOR TO STENT  &  50% LESION IN MID PORTION OF LAD.  Marland Kitchen CARDIOVASCULAR STRESS TEST  05-30-2010   DR BERRY   LOW RISK SCAN/  MODERATE SIZED INEROLATERAL SCAR WITHOUT ISCHEMIA / ISCHEMIA/ NO SIGNIFICANT CHANGE FROM PREVIOUS STUDY/   . COLONOSCOPY WITH PROPOFOL N/A 09/23/2015   Procedure: COLONOSCOPY WITH PROPOFOL;  Surgeon: Manya Silvas, MD;  Location: [facility] ENDOSCOPY;  Service: Endoscopy;  Laterality: N/A;  . CORONARY ANGIOPLASTY WITH STENT PLACEMENT  DEC 2004  AT [facility]   STENTING OF CIRCUMFLEX AND LAD  . ESOPHAGOGASTRODUODENOSCOPY (EGD) WITH PROPOFOL N/A 09/23/2015   Procedure:  ESOPHAGOGASTRODUODENOSCOPY (EGD) WITH PROPOFOL;  Surgeon: Manya Silvas, MD;  Location: [facility] ENDOSCOPY;  Service: Endoscopy;  Laterality: N/A;  . HEMORRHOID SURGERY    . INGUINAL HERNIA REPAIR  1980'S  . PROSTATE BIOPSY  02/29/12   ADENOCARCINOMA  . RADIOACTIVE SEED IMPLANT N/A 05/12/2012   Procedure: RADIOACTIVE SEED IMPLANT;  Surgeon: Franchot Gallo, MD;  Location: [facility];  Service: Urology;  Laterality: N/A;  DR PORTABLE/RAD TECH NEEDED  . RIGHT/LEFT HEART CATH AND CORONARY ANGIOGRAPHY N/A 03/07/2019   Procedure: RIGHT/LEFT HEART CATH AND CORONARY ANGIOGRAPHY;  Surgeon: Sherren Mocha, MD;  Location: [facility];  Service: Cardiovascular;  Laterality: N/A;  . TRANSTHORACIC ECHOCARDIOGRAM  05-30-2010   MILD CONCENTRIC LVH/ EF [id number] STAGE I DIASTOLIC DYSFUCTION/ MILD MR, TR, & AR/ MILD SCLEROTIC AORTIC VALVE WITHOUT STENOSIS    There were no vitals filed for this visit.   Subjective Assessment - 03/20/19 1418    Subjective  No limitations functionally. found blockages when they were doing previous surgery.    Currently in Pain?  No/denies         [facility] PT Assessment - 03/20/19 0001      Assessment   Medical Diagnosis  severe aortic stenosis    Referring Provider (PT)  Sherren Mocha, MD    Hand Dominance  Right      Precautions   Precautions  None      Restrictions   Weight Bearing Restrictions  No      Balance Screen   Has the patient fallen in the past 6 months  Yes    How many times?  1    Has the patient had a decrease in activity level because of a fear of falling?   No    Is the patient reluctant to leave their home because of a fear of falling?   No      Home Film/video editor residence    Living Arrangements  Spouse/significant other    Additional Comments  no stairs      Prior Function   Level of Independence  Independent    Vocation  Retired      Associate Professor   Overall Cognitive Status  Within Functional  Limits for tasks assessed      Sensation   Additional Comments  [facility]      Posture/Postural Control   Posture Comments  forward head, rounded shoulders      ROM / Strength   AROM / PROM / Strength  AROM;Strength      AROM   Overall AROM Comments  WFL      Strength   Overall Strength Comments  mild lack of strength in Rt shoulder vs Lt; LEs bilat 5/5    Strength Assessment Site  Hand    Right/Left hand  Right;Left    Right Hand Grip (lbs)  70    Left Hand Grip (lbs)  55       [facility] Pre-Surgical Assessment - 03/20/19 0001    5 Meter Walk Test- trial 1  2 sec    5 Meter Walk Test- trial 2  2 sec.     5 Meter Walk Test- trial 3  2 sec.    5 meter walk test average  2 sec    4 Stage Balance Test Position  4    Sit To Stand Test- trial 1  13 sec.    6 Minute Walk- Baseline  yes    BP (mmHg)  166/74    HR (bpm)  56    02 Sat (%RA)  99 %    Modified Borg Scale for Dyspnea  0- Nothing at all    Perceived Rate of Exertion (Borg)  6-    6 Minute Walk Post Test  yes    BP (mmHg)  149/63    HR (bpm)  67    02 Sat (%RA)  98 %    Modified Borg Scale for Dyspnea  3- Moderate shortness of breath or breathing difficulty    Perceived Rate of Exertion (Borg)  11- Fairly light    Aerobic Endurance Distance Walked  1365    Endurance additional comments  .22% disability compared to age-related normative values              Objective measurements completed on examination: See above findings.                           Plan - 03/20/19 1448    PT Frequency  One time visit    Consulted and Agree with Plan of Care  Patient;Family member/caregiver    Family Member Consulted  Wife      Clinical Impression Statement: Pt is a 79 yo M presenting to OP PT for evaluation prior to possible TAVR surgery due to severe aortic stenosis.  Pt denies symptoms that limit his functional daily activities. Pt presents with WFL ROM and strength and denies musculoskeletal pain.  Pt  ambulated a total of 1365 feet in 6 minute walk and reported 3/10 SOB on modified scale for dyspena and 11/20 RPE on Borg's perceived exertion and pain scale at the end of the walk. During the 6 minute walk test, patient's HR increased to 97 BPM and O2 saturation decreased to 95%. Based on the Short Physical Performance Battery, patient has a frailty rating of 12/12 with </= 5/12 considered frail.   Visit Diagnosis: Difficulty in walking, not elsewhere classified     Problem List Patient Active Problem List   Diagnosis Date Noted  . Primary osteoarthritis of right knee 10/21/2017  . Dilated cardiomyopathy ([place]) 07/15/2017  . Severe aortic stenosis 07/13/2016  . Benign essential hypertension 01/09/2016  . Hyperlipidemia, mixed 07/10/2015  . Carotid artery disease ([place]) 01/16/2015  . 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 CAD (coronary artery disease) 01/11/2013  . Essential hypertension 01/11/2013  . Hyperlipidemia 01/11/2013  . Left bundle branch block 01/11/2013  . History of elevated PSA   . Prostate cancer ([place]) 02/24/2012   Randale Carvalho C. Hammond Obeirne PT, DPT 03/20/19 2:51 PM   [facility] [address] [place], [place], [address] Phone: [phone]   Fax:  [phone]  Name: Daniel Hanna MRN: [id number] Date of Birth: February 07, 1940

## 2019-03-20 NOTE — Progress Notes (Signed)
MRN : [id number]  Daniel Hanna is a 79 y.o. (1940-06-14) male who presents with chief complaint of  Chief Complaint  Patient presents with  . Follow-up    u/s follow up  .  History of Present Illness:   The patient is seen for follow up evaluation of carotid stenosis. The carotid stenosis followed by ultrasound.   The patient denies amaurosis fugax. There is no recent history of TIA symptoms or focal motor deficits. There is no prior documented CVA.  The patient is taking enteric-coated aspirin 81 mg daily.  There is no history of migraine headaches. There is no history of seizures.  The patient has a history of coronary artery disease, no recent episodes of angina or shortness of breath.  Recent cardiac cath showed severe aortic stenosis and 90% LAD and Circ lesion.  The patient denies PAD or claudication symptoms. There is a history of hyperlipidemia which is being treated with a statin.    Carotid Duplex done today shows RICA detailed in and [id number] and LICA <virus [id number].  No change compared to last study in 3/22019   Current Meds  Medication Sig  . amLODipine (NORVASC) 2.5 MG tablet Take 2.5 mg by mouth daily.   Marland Kitchen aspirin 81 MG tablet Take 81 mg by mouth daily.  . carvedilol (COREG) 3.125 MG tablet Take 3.125 mg by mouth daily.   . clopidogrel (PLAVIX) 75 MG tablet Take 75 mg by mouth daily.  . hydrOXYzine (ATARAX/VISTARIL) 25 MG tablet Take 25 mg by mouth daily as needed for itching.  Marland Kitchen lisinopril (ZESTRIL) 10 MG tablet Take 10 mg by mouth daily.  . pravastatin (PRAVACHOL) 20 MG tablet Take 20 mg by mouth daily.     Past Medical History:  Diagnosis Date  . Arthritis    fingers  . Coronary artery disease CARDIOLOGIST--   DR BERRY--  LOV NOTE 01-29-2012  W/ CHART  . ED (erectile dysfunction)   . History of coronary artery stent placement    STENTS X2  TO LAD AND CIRCUMFLEX  . History of elevated PSA 2003   PSA=0.95  . History of myocardial infarction DEC 2004   S/P  STENT X2  AT [facility]  . Hypercholesterolemia   . Hypertension   . LBBB (left bundle branch block)   . Prostate cancer ([place]) 02/24/12   BX=Adenocarcinoma,Gleason 3+3=6,3+4=7 PSA=5.80    Past Surgical History:  Procedure Laterality Date  . CARDIAC CATHETERIZATION  03-02-2006  DR BERRY   PATENT STENTS/ 30-40% TAPERED DISTAL LEFT MAIN AND 50-60% AV GROOVE CIRCUMFLEX PRIOR TO STENT  &  50% LESION IN MID PORTION OF LAD.  Marland Kitchen CARDIOVASCULAR STRESS TEST  05-30-2010   DR BERRY   LOW RISK SCAN/  MODERATE SIZED INEROLATERAL SCAR WITHOUT ISCHEMIA / ISCHEMIA/ NO SIGNIFICANT CHANGE FROM PREVIOUS STUDY/   . COLONOSCOPY WITH PROPOFOL N/A 09/23/2015   Procedure: COLONOSCOPY WITH PROPOFOL;  Surgeon: Manya Silvas, MD;  Location: [facility] ENDOSCOPY;  Service: Endoscopy;  Laterality: N/A;  . CORONARY ANGIOPLASTY WITH STENT PLACEMENT  DEC 2004  AT [facility]   STENTING OF CIRCUMFLEX AND LAD  . ESOPHAGOGASTRODUODENOSCOPY (EGD) WITH PROPOFOL N/A 09/23/2015   Procedure: ESOPHAGOGASTRODUODENOSCOPY (EGD) WITH PROPOFOL;  Surgeon: Manya Silvas, MD;  Location: [facility] ENDOSCOPY;  Service: Endoscopy;  Laterality: N/A;  . HEMORRHOID SURGERY    . INGUINAL HERNIA REPAIR  1980'S  . PROSTATE BIOPSY  02/29/12   ADENOCARCINOMA  . RADIOACTIVE SEED IMPLANT N/A 05/12/2012   Procedure: RADIOACTIVE SEED IMPLANT;  Surgeon: Franchot Gallo, MD;  Location[facility];  Service: Urology;  Laterality: N/A;  DR PORTABLE/RAD TECH NEEDED  . RIGHT/LEFT HEART CATH AND CORONARY ANGIOGRAPHY N/A 03/07/2019   Procedure: RIGHT/LEFT HEART CATH AND CORONARY ANGIOGRAPHY;  Surgeon: Sherren Mocha, MD;  Location: [facility];  Service: Cardiovascular;  Laterality: N/A;  . TRANSTHORACIC ECHOCARDIOGRAM  05-30-2010   MILD CONCENTRIC LVH/ EF [id number] STAGE I DIASTOLIC DYSFUCTION/ MILD MR, TR, & AR/ MILD SCLEROTIC AORTIC VALVE WITHOUT STENOSIS    Social History Social History   Tobacco Use  . Smoking status: Former Smoker    Packs/day: 1.00      Years: 25.00    Pack years: 25.00    Quit date: 05/06/2001    Years since quitting: 17.8  . Smokeless tobacco: Never Used  Substance Use Topics  . Alcohol use: No  . Drug use: No    Family History Family History  Problem Relation Age of Onset  . Diabetes Mother   . Stroke Mother   . Cancer Sister 40       unknown    Allergies  Allergen Reactions  . Codeine Other (See Comments)    Deriviatives, dysfunctional ,woozy,spinning,dizzy     REVIEW OF SYSTEMS (Negative unless checked)  Constitutional: [] Weight loss  [] Fever  [] Chills Cardiac: [x] Chest pain   [] Chest pressure   [] Palpitations   [] Shortness of breath when laying flat   [] Shortness of breath with exertion. Vascular:  [] Pain in legs with walking   [] Pain in legs at rest  [] History of DVT   [] Phlebitis   [] Swelling in legs   [] Varicose veins   [] Non-healing ulcers Pulmonary:   [] Uses home oxygen   [] Productive cough   [] Hemoptysis   [] Wheeze  [] COPD   [] Asthma Neurologic:  [] Dizziness   [] Seizures   [] History of stroke   [] History of TIA  [] Aphasia   [] Vissual changes   [] Weakness or numbness in arm   [] Weakness or numbness in leg Musculoskeletal:   [] Joint swelling   [] Joint pain   [] Low back pain Hematologic:  [] Easy bruising  [] Easy bleeding   [] Hypercoagulable state   [] Anemic Gastrointestinal:  [] Diarrhea   [] Vomiting  [] Gastroesophageal reflux/heartburn   [] Difficulty swallowing. Genitourinary:  [] Chronic kidney disease   [] Difficult urination  [] Frequent urination   [] Blood in urine Skin:  [] Rashes   [] Ulcers  Psychological:  [] History of anxiety   []  History of major depression.  Physical Examination  Vitals:   03/20/19 1110  BP: 131/80  Pulse: 61  Resp: 15  Weight: 198 lb 12.8 oz (90.2 kg)   Body mass index is 27.73 kg/m. Gen: WD/WN, NAD Head: [place]/AT, No temporalis wasting.  Ear/Nose/Throat: Hearing grossly intact, nares w/o erythema or drainage Eyes: PER, EOMI, sclera nonicteric.  Neck: Supple, no  large masses.   Pulmonary:  Good air movement, no audible wheezing bilaterally, no use of accessory muscles.  Cardiac: RRR, no JVD Vascular:  Right carotid bruit Vessel Right Left  Radial Palpable Palpable  Brachial Palpable Palpable  Carotid Palpable Palpable  Gastrointestinal: Non-distended. No guarding/no peritoneal signs.  Musculoskeletal: M/S 5/5 throughout.  No deformity or atrophy.  Neurologic: CN 2-12 intact. Symmetrical.  Speech is fluent. Motor exam as listed above. Psychiatric: Judgment intact, Mood & affect appropriate for pt's clinical situation. Dermatologic: No rashes or ulcers noted.  No changes consistent with cellulitis.   CBC Lab Results  Component Value Date   WBC 7.4 03/03/2019   HGB 12.6 (L) 03/07/2019  HCT 37.0 (L) 03/07/2019   MCV 93 03/03/2019   PLT 193 03/03/2019    BMET    Component Value Date/Time   NA 143 03/07/2019 1137   NA 139 03/03/2019 1145   K 3.5 03/07/2019 1137   CL 104 03/03/2019 1145   CO2 19 (L) 03/03/2019 1145   GLUCOSE 102 (H) 03/03/2019 1145   GLUCOSE 118 (H) 05/05/2012 0808   BUN 18 03/03/2019 1145   CREATININE 1.05 03/03/2019 1145   CALCIUM 9.0 03/03/2019 1145   GFRNONAA 67 03/03/2019 1145   GFRAA 77 03/03/2019 1145   Estimated Creatinine Clearance: 59.8 mL/min (by C-G formula based on SCr of 1.05 mg/dL).  COAG Lab Results  Component Value Date   INR 1.05 05/05/2012    Radiology CARDIAC CATHETERIZATION  Result Date: 03/07/2019 1. Severe multivessel CAD with severe proximal LAD stenosis, severe ramus stenosis, and severe subtotal occlusion of the left circumflex 2. 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 Moderate aortic stenosis with peak to peak gradient of 32 mmHg and mean gradient 17 mmHg Plan: repeat echo to confirm severe AS (echo mean gradient reported >40 mmHg), multidisciplinary review of treatment options (CABG/AVR, PCI/TAVR, med  Rx)  CT CORONARY MORPH W/CTA COR W/SCORE W/CA W/CM &/OR WO/CM  Addendum Date: 03/17/2019   ADDENDUM REPORT: 03/17/2019 06:03 CLINICAL DATA:  Aortic stenosis EXAM: Cardiac TAVR CT TECHNIQUE: The patient was scanned on a Siemens Force [id number] slice scanner. A 120 kV retrospective scan was triggered in the descending thoracic aorta at 111 HU's. Gantry rotation speed was 270 msecs and collimation was .9 mm. No beta blockade or nitro were given. The 3D data set was reconstructed in 5% intervals of the R-R cycle. Systolic and diastolic phases were analyzed on a dedicated work station using MPR, MIP and VRT modes. The patient received 149mL OMNIPAQUE IOHEXOL 350 MG/ML SOLN of contrast. FINDINGS: Aortic Valve: Trileaflet aortic valve. Severely reduced cusp separation. Moderately thickened, severely calcified aortic valve cusps. AV calcium score: 2427 Virtual Basal Annulus Measurements: Maximum/Minimum Diameter: 24.3 x 21.3 mm Perimeter: 72 mm Area: 404 mm2 LVOT calcifications present, mild. Based on these measurements, the annulus would be suitable for a 23 mm Sapien 3 valve. Sinus of Valsalva Measurements: Non-coronary: 32 mm Right - coronary: 30 mm Left - coronary: 32 mm Sinus of Valsalva Height: Left: 24.3 mm Right:19.9 mm Aorta: Conventional 3 vessel branch pattern of aortic arch. Severe mixed atherosclerotic plaque in the aortic arch and descending thoracic aorta. 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 Optimum Fluoroscopic Angle for Delivery: RAO 2 CRA 3 No left atrial appendage thrombus. IMPRESSION: 1. Trileaflet aortic valve. Severely reduced cusp separation. Moderately thickened, severely calcified aortic valve cusps. 2.  AV calcium score 2427 3.  Annulus area 404 mm2, suitable for 23  mm Sapien 3 valve. 4.  Mild LVOT calcification present. 5. Normal caliber thoracic aorta. Severe mixed atherosclerotic plaque in the arch and descending thoracic aorta. 6. Optimum Fluoroscopic Angle for Delivery: RAO 2 CRA 3 7. 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 Adequate coronary ostial height. Electronically Signed   By: Cherlynn Kaiser   On: 03/17/2019 06:03   Result Date: 03/17/2019 EXAM: OVER-READ INTERPRETATION  CT CHEST  The following report is an over-read performed by radiologist Dr. Salvatore Marvel of [facility], [place] on 03/15/2019. This over-read does not include interpretation of cardiac or coronary anatomy or pathology. The coronary CTA interpretation by the cardiologist is attached. COMPARISON:  01/04/2014 chest CT. FINDINGS: Please see the separate concurrent chest CT angiogram report for details. IMPRESSION: Please see the separate concurrent chest CT angiogram report for details. Electronically Signed: By: Ilona Sorrel M.D. On: 03/15/2019 12:35   CT ANGIO CHEST AORTA W/CM & OR WO/CM  Result Date: 03/15/2019 CLINICAL DATA:  Aortic valve stenosis. Pre-TAVR evaluation. EXAM: CT ANGIOGRAPHY CHEST, ABDOMEN AND PELVIS TECHNIQUE: Multidetector CT imaging through the chest, abdomen and pelvis was performed using the standard protocol during bolus administration of intravenous contrast. Multiplanar reconstructed images and MIPs were obtained and reviewed to evaluate the vascular anatomy. CONTRAST:  127mL OMNIPAQUE IOHEXOL 350 MG/ML SOLN COMPARISON:  01/04/2014 chest CT. 06/10/1999 CT abdomen/pelvis. FINDINGS: CTA CHEST FINDINGS Cardiovascular: Mild cardiomegaly. Diffuse thickening and coarse calcification of the aortic valve. No significant pericardial effusion/thickening. Three-vessel coronary atherosclerosis. Atherosclerotic nonaneurysmal thoracic aorta. Aortic arch branch vessels are patent. Normal caliber main pulmonary artery. No central pulmonary emboli. Mediastinum/Nodes: No discrete  thyroid nodules. Unremarkable esophagus. No pathologically enlarged axillary, mediastinal or hilar lymph nodes. Lungs/Pleura: No pneumothorax. Small calcified bilateral pleural plaques. No pleural effusions. No central airway stenoses. Moderate compressive atelectasis in the medial left lower lobe from the hiatal hernia. Bandlike pleural-parenchymal scarring in dependent right lower lobe is new. No lung masses or significant pulmonary nodules. Musculoskeletal: No aggressive appearing focal osseous lesions. Mild thoracic spondylosis. CTA ABDOMEN AND PELVIS FINDINGS Hepatobiliary: Slight liver surface irregularity, cannot exclude cirrhosis. No liver masses. Cholelithiasis. No biliary ductal dilatation. Pancreas: Normal, with no mass or duct dilation. Spleen: Normal size. No mass. Adrenals/Urinary Tract: Normal adrenals. No hydronephrosis. No contour deforming renal masses. Normal bladder. Stomach/Bowel: Large hiatal hernia. Otherwise normal nondistended stomach. Normal caliber small bowel with no small bowel wall thickening. Normal appendix. Marked diffuse colonic diverticulosis, with no large bowel wall thickening or significant pericolonic fat stranding. Vascular/Lymphatic: Atherosclerotic abdominal aorta with 3.0 cm infrarenal abdominal aortic aneurysm. No pathologically enlarged lymph nodes in the abdomen or pelvis. Reproductive: Top-normal size prostate. Brachytherapy seeds throughout the prostate. Other: No pneumoperitoneum, ascites or focal fluid collection. Musculoskeletal: No aggressive appearing focal osseous lesions. Moderate lower lumbar spondylosis. VASCULAR MEASUREMENTS PERTINENT TO TAVR: AORTA: Minimal Aortic Diameter-11.4 x 10.7 mm Severity of Aortic Calcification-severe RIGHT PELVIS: Right Common Iliac Artery - Minimal Diameter-7.5 x 7.3 mm Tortuosity-mild Calcification-severe Right External Iliac Artery - Minimal Diameter-8.6 x 6.8 mm Tortuosity-severe Calcification-mild Right Common Femoral Artery -  Minimal Diameter-7.7 x 7.0 mm Tortuosity-mild Calcification-moderate to severe LEFT PELVIS: Left Common Iliac Artery - Minimal Diameter-8.4 x 7.6 mm Tortuosity-mild Calcification-severe Left External Iliac Artery - Minimal Diameter-7.3 x 6.3 mm Tortuosity-severe Calcification-mild Left Common Femoral Artery - Minimal Diameter-8.1 x 3.7 mm Tortuosity-mild Calcification-severe Review of the MIP images confirms the above findings. IMPRESSION: 1. Vascular findings and measurements pertinent to potential TAVR procedure, as detailed. 2. Diffuse thickening and coarse calcification of the aortic valve, compatible with the reported history of aortic stenosis. 3. Mild cardiomegaly. Three-vessel coronary atherosclerosis. 4. Large hiatal hernia. 5. Slight liver surface irregularity, cannot exclude cirrhosis. No liver masses. 6. Cholelithiasis. 7. Marked diffuse colonic diverticulosis. 8. 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 No pleural effusions. Pleural-parenchymal scarring in the dependent right lung base. 9. 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 Recommend follow-up  aortic ultrasound in 3 years. This recommendation follows ACR consensus guidelines: White Paper of the ACR Incidental Findings Committee II on Vascular Findings. J Am Coll Radiol 2013; 10:789-794. 10.  Aortic Atherosclerosis (ICD10-I70.0). Electronically Signed   By: Ilona Sorrel M.D.   On: 03/15/2019 13:16   ECHOCARDIOGRAM COMPLETE  Result Date: 03/07/2019   ECHOCARDIOGRAM REPORT   Patient Name:   Daniel Hanna Date of Exam: 03/07/2019 Medical Rec #:  [id number]    Height:       71.0 in Accession #:    [id number]   Weight:       198.0 lb Date of Birth:  Jun 08, 1940    BSA:          2.10 m Patient Age:    30 years     BP:           150/65 mmHg Patient Gender: M            HR:           58 bpm. Exam Location:  Inpatient Procedure: 2D Echo, Cardiac Doppler and Color Doppler Indications:    Aortic  stenosis  History:        Patient has prior history of Echocardiogram examinations, most                 recent 04/13/2017. CAD, Aortic Valve Disease, Arrythmias:LBBB;                 Risk Factors:Hypertension and Dyslipidemia.  Sonographer:    Dustin Flock Referring Phys: [place]  1. Left ventricular ejection fraction, by visual estimation, is 40 to 45%. The left ventricle has mild to moderately decreased function. There is no left ventricular hypertrophy.  2. The left ventricle demonstrates regional wall motion abnormalities with inferolateral akinesis .  3. Elevated left atrial and left ventricular end-diastolic pressures.  4. Left ventricular diastolic parameters are consistent with Grade I diastolic dysfunction (impaired relaxation).  5. Global right ventricle has normal systolic function.The right ventricular size is normal. No increase in right ventricular wall thickness.  6. Left atrial size was moderately dilated.  7. Right atrial size was normal.  8. Moderate to severe mitral annular calcification. Mild mitral valve regurgitation. No evidence of mitral stenosis.  9. The tricuspid valve is normal in structure. Tricuspid valve regurgitation is not demonstrated. 10. The aortic valve is tricuspid. . There is severe thickening and severe calcifcation of the aortic valve. Aortic valve regurgitation is mild. Aortic regurgitation PHT measures 590 msec. Severe aortic stenosis is present. Severe aortic valve annular calcification. There is severe thickening of the aortic valve. There is severe calcifcation of the aortic valve. Aortic valve mean gradient measures 39.0 mmHg. Aortic valve peak gradient measures 68.6 mmHg. Aortic valve area, by VTI measures 1.14 cm. 11. The pulmonic valve was normal in structure. Pulmonic valve regurgitation is trivial. 12. The inferior vena cava is normal in size with greater than 50% respiratory variability, suggesting right atrial pressure of 3 mmHg. 13.  TR signal is inadequate for assessing pulmonary artery systolic pressure. FINDINGS  Left Ventricle: Left ventricular ejection fraction, by visual estimation, is 40 to 45%. The left ventricle has mild to moderately decreased function. There is no left ventricular hypertrophy. Left ventricular diastolic parameters are consistent with Grade I diastolic dysfunction (impaired relaxation). Elevated left atrial and left ventricular end-diastolic pressures.  Right Ventricle: The right ventricular size is normal. No increase in right ventricular wall thickness. Global RV systolic function  is has normal systolic function. Left Atrium: Left atrial size was moderately dilated. Right Atrium: Right atrial size was normal in size Pericardium: There is no evidence of pericardial effusion. Mitral Valve: The mitral valve is normal in structure. Moderate to severe mitral annular calcification. Mild mitral valve regurgitation. No evidence of mitral valve stenosis by observation. Tricuspid Valve: The tricuspid valve is normal in structure. Tricuspid valve regurgitation is not demonstrated. Aortic Valve: The aortic valve is tricuspid. . There is severe thickening and severe calcifcation of the aortic valve. Aortic valve regurgitation is mild. Aortic regurgitation PHT measures 590 msec. Severe aortic stenosis is present. Severe aortic valve annular calcification. There is severe thickening of the aortic valve. There is severe calcifcation of the aortic valve. Aortic valve mean gradient measures 39.0 mmHg. Aortic valve peak gradient measures 68.6 mmHg. Aortic valve area, by VTI measures 1.14  cm. Pulmonic Valve: The pulmonic valve was normal in structure. Pulmonic valve regurgitation is trivial. Pulmonic regurgitation is trivial. Aorta: The aortic root, ascending aorta and aortic arch are all structurally normal, with no evidence of dilitation or obstruction. Venous: The inferior vena cava is normal in size with greater than 50%  respiratory variability, suggesting right atrial pressure of 3 mmHg. IAS/Shunts: No atrial level shunt detected by color flow Doppler. There is no evidence of a patent foramen ovale. No ventricular septal defect is seen or detected. There is no evidence of an atrial septal defect.  LEFT VENTRICLE PLAX 2D LVIDd:         5.30 cm  Diastology LVIDs:         4.70 cm  LV e' lateral:   5.00 cm/s LV PW:         1.10 cm  LV E/e' lateral: 24.0 LV IVS:        1.10 cm  LV e' medial:    5.77 cm/s LVOT diam:     2.00 cm  LV E/e' medial:  20.8 LV SV:         33 ml LV SV Index:   15.44 LVOT Area:     3.14 cm  RIGHT VENTRICLE RV Basal diam:  2.80 cm RV S prime:     13.40 cm/s TAPSE (M-mode): 2.7 cm LEFT ATRIUM              Index       RIGHT ATRIUM           Index LA diam:        4.70 cm  2.24 cm/m  RA Area:     19.40 cm LA Vol (A2C):   88.6 ml  42.20 ml/m RA Volume:   57.20 ml  27.24 ml/m LA Vol (A4C):   101.0 ml 48.11 ml/m LA Biplane Vol: 95.2 ml  45.34 ml/m  AORTIC VALVE AV Area (Vmax):    1.05 cm AV Area (Vmean):   1.03 cm AV Area (VTI):     1.14 cm AV Vmax:           414.00 cm/s AV Vmean:          302.000 cm/s AV VTI:            1.030 m AV Peak Grad:      68.6 mmHg AV Mean Grad:      39.0 mmHg LVOT Vmax:         139.00 cm/s LVOT Vmean:        98.900 cm/s LVOT VTI:  0.373 m LVOT/AV VTI ratio: 0.36 AI PHT:            590 msec  AORTA Ao Root diam: 3.10 cm MITRAL VALVE MV Area (PHT): 2.32 cm              SHUNTS MV PHT:        94.83 msec            Systemic VTI:  0.37 m MV Decel Time: 327 msec              Systemic Diam: 2.00 cm MV E velocity: 120.00 cm/s 103 cm/s MV A velocity: 162.00 cm/s 70.3 cm/s MV E/A ratio:  0.74        1.5  Fransico Him MD Electronically signed by Fransico Him MD Signature Date/Time: 03/07/2019/4:30:10 PM    Final    CT ANGIO ABDOMEN PELVIS  W &/OR WO CONTRAST  Result Date: 03/15/2019 CLINICAL DATA:  Aortic valve stenosis. Pre-TAVR evaluation. EXAM: CT ANGIOGRAPHY CHEST, ABDOMEN AND  PELVIS TECHNIQUE: Multidetector CT imaging through the chest, abdomen and pelvis was performed using the standard protocol during bolus administration of intravenous contrast. Multiplanar reconstructed images and MIPs were obtained and reviewed to evaluate the vascular anatomy. CONTRAST:  193mL OMNIPAQUE IOHEXOL 350 MG/ML SOLN COMPARISON:  01/04/2014 chest CT. 06/10/1999 CT abdomen/pelvis. FINDINGS: CTA CHEST FINDINGS Cardiovascular: Mild cardiomegaly. Diffuse thickening and coarse calcification of the aortic valve. No significant pericardial effusion/thickening. Three-vessel coronary atherosclerosis. Atherosclerotic nonaneurysmal thoracic aorta. Aortic arch branch vessels are patent. Normal caliber main pulmonary artery. No central pulmonary emboli. Mediastinum/Nodes: No discrete thyroid nodules. Unremarkable esophagus. No pathologically enlarged axillary, mediastinal or hilar lymph nodes. Lungs/Pleura: No pneumothorax. Small calcified bilateral pleural plaques. No pleural effusions. No central airway stenoses. Moderate compressive atelectasis in the medial left lower lobe from the hiatal hernia. Bandlike pleural-parenchymal scarring in dependent right lower lobe is new. No lung masses or significant pulmonary nodules. Musculoskeletal: No aggressive appearing focal osseous lesions. Mild thoracic spondylosis. CTA ABDOMEN AND PELVIS FINDINGS Hepatobiliary: Slight liver surface irregularity, cannot exclude cirrhosis. No liver masses. Cholelithiasis. No biliary ductal dilatation. Pancreas: Normal, with no mass or duct dilation. Spleen: Normal size. No mass. Adrenals/Urinary Tract: Normal adrenals. No hydronephrosis. No contour deforming renal masses. Normal bladder. Stomach/Bowel: Large hiatal hernia. Otherwise normal nondistended stomach. Normal caliber small bowel with no small bowel wall thickening. Normal appendix. Marked diffuse colonic diverticulosis, with no large bowel wall thickening or significant pericolonic  fat stranding. Vascular/Lymphatic: Atherosclerotic abdominal aorta with 3.0 cm infrarenal abdominal aortic aneurysm. No pathologically enlarged lymph nodes in the abdomen or pelvis. Reproductive: Top-normal size prostate. Brachytherapy seeds throughout the prostate. Other: No pneumoperitoneum, ascites or focal fluid collection. Musculoskeletal: No aggressive appearing focal osseous lesions. Moderate lower lumbar spondylosis. VASCULAR MEASUREMENTS PERTINENT TO TAVR: AORTA: Minimal Aortic Diameter-11.4 x 10.7 mm Severity of Aortic Calcification-severe RIGHT PELVIS: Right Common Iliac Artery - Minimal Diameter-7.5 x 7.3 mm Tortuosity-mild Calcification-severe Right External Iliac Artery - Minimal Diameter-8.6 x 6.8 mm Tortuosity-severe Calcification-mild Right Common Femoral Artery - Minimal Diameter-7.7 x 7.0 mm Tortuosity-mild Calcification-moderate to severe LEFT PELVIS: Left Common Iliac Artery - Minimal Diameter-8.4 x 7.6 mm Tortuosity-mild Calcification-severe Left External Iliac Artery - Minimal Diameter-7.3 x 6.3 mm Tortuosity-severe Calcification-mild Left Common Femoral Artery - Minimal Diameter-8.1 x 3.7 mm Tortuosity-mild Calcification-severe Review of the MIP images confirms the above findings. IMPRESSION: 1. Vascular findings and measurements pertinent to potential TAVR procedure, as detailed. 2. Diffuse thickening and coarse calcification of the aortic valve, compatible with  the reported history of aortic stenosis. 3. Mild cardiomegaly. Three-vessel coronary atherosclerosis. 4. Large hiatal hernia. 5. Slight liver surface irregularity, cannot exclude cirrhosis. No liver masses. 6. Cholelithiasis. 7. Marked diffuse colonic diverticulosis. 8. 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 No pleural effusions. Pleural-parenchymal scarring in the dependent right lung base. 9. 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 Recommend follow-up aortic  ultrasound in 3 years. This recommendation follows ACR consensus guidelines: White Paper of the ACR Incidental Findings Committee II on Vascular Findings. J Am Coll Radiol 2013; 10:789-794. 10.  Aortic Atherosclerosis (ICD10-I70.0). Electronically Signed   By: Ilona Sorrel M.D.   On: 03/15/2019 13:16     Assessment/Plan 1. Stenosis of right carotid artery Recommend:  Given the patient's asymptomatic subcritical stenosis no further invasive testing or surgery at this time.  Duplex ultrasound shows 60% calcified RICA and 0000000 LICA stenosis.  Continue antiplatelet therapy as prescribed Continue management of CAD, HTN and Hyperlipidemia Healthy heart diet,  encouraged exercise at least 4 times per week Follow up in 6 months with duplex ultrasound and physical exam   - VAS US CAROTID; Future  2. Coronary artery disease involving native coronary artery of native heart without angina pectoris Continue cardiac and antihypertensive medications as already ordered and reviewed, no changes at this time.  Continue statin as ordered and reviewed, no changes at this time  Nitrates PRN for chest pain   3. Benign essential hypertension Continue antihypertensive medications as already ordered, these medications have been reviewed and there are no changes at this time.   4. Hyperlipidemia, mixed Continue statin as ordered and reviewed, no changes at this time     Hortencia Pilar, MD  03/20/2019 11:36 AM

## 2019-03-21 ENCOUNTER — Encounter: Payer: Self-pay | Admitting: *Deleted

## 2019-03-30 ENCOUNTER — Other Ambulatory Visit: Payer: Self-pay

## 2019-03-30 MED ORDER — NITROGLYCERIN 0.4 MG SL SUBL: 0.4000 mg | SUBLINGUAL_TABLET | SUBLINGUAL | 1 refills | Status: AC | PRN

## 2019-04-02 ENCOUNTER — Emergency Department: Payer: PPO

## 2019-04-02 ENCOUNTER — Other Ambulatory Visit: Payer: Self-pay

## 2019-04-02 ENCOUNTER — Emergency Department
Admission: EM | Admit: 2019-04-02 | Discharge: 2019-05-04 | Disposition: E | Payer: PPO | Attending: Emergency Medicine | Admitting: Emergency Medicine

## 2019-04-02 DIAGNOSIS — Z7982 Long term (current) use of aspirin: Secondary | ICD-10-CM | POA: Insufficient documentation

## 2019-04-02 DIAGNOSIS — I200 Unstable angina: Secondary | ICD-10-CM

## 2019-04-02 DIAGNOSIS — Z79899 Other long term (current) drug therapy: Secondary | ICD-10-CM | POA: Diagnosis not present

## 2019-04-02 DIAGNOSIS — Z20822 Contact with and (suspected) exposure to covid-19: Secondary | ICD-10-CM | POA: Diagnosis not present

## 2019-04-02 DIAGNOSIS — I509 Heart failure, unspecified: Secondary | ICD-10-CM | POA: Diagnosis not present

## 2019-04-02 DIAGNOSIS — Z7902 Long term (current) use of antithrombotics/antiplatelets: Secondary | ICD-10-CM | POA: Insufficient documentation

## 2019-04-02 DIAGNOSIS — R0789 Other chest pain: Secondary | ICD-10-CM | POA: Diagnosis not present

## 2019-04-02 DIAGNOSIS — J810 Acute pulmonary edema: Secondary | ICD-10-CM | POA: Insufficient documentation

## 2019-04-02 DIAGNOSIS — R570 Cardiogenic shock: Secondary | ICD-10-CM | POA: Insufficient documentation

## 2019-04-02 DIAGNOSIS — Z9861 Coronary angioplasty status: Secondary | ICD-10-CM | POA: Diagnosis not present

## 2019-04-02 DIAGNOSIS — I447 Left bundle-branch block, unspecified: Secondary | ICD-10-CM | POA: Diagnosis not present

## 2019-04-02 DIAGNOSIS — I469 Cardiac arrest, cause unspecified: Secondary | ICD-10-CM | POA: Insufficient documentation

## 2019-04-02 DIAGNOSIS — R0602 Shortness of breath: Secondary | ICD-10-CM | POA: Insufficient documentation

## 2019-04-02 DIAGNOSIS — Z952 Presence of prosthetic heart valve: Secondary | ICD-10-CM | POA: Insufficient documentation

## 2019-04-02 DIAGNOSIS — I2510 Atherosclerotic heart disease of native coronary artery without angina pectoris: Secondary | ICD-10-CM | POA: Insufficient documentation

## 2019-04-02 DIAGNOSIS — R079 Chest pain, unspecified: Secondary | ICD-10-CM | POA: Diagnosis not present

## 2019-04-02 DIAGNOSIS — I119 Hypertensive heart disease without heart failure: Secondary | ICD-10-CM | POA: Insufficient documentation

## 2019-04-02 DIAGNOSIS — Z4682 Encounter for fitting and adjustment of non-vascular catheter: Secondary | ICD-10-CM | POA: Diagnosis not present

## 2019-04-02 DIAGNOSIS — R0902 Hypoxemia: Secondary | ICD-10-CM | POA: Diagnosis not present

## 2019-04-02 LAB — CBC WITH DIFFERENTIAL/PLATELET
Abs Immature Granulocytes: 0.07 10*3/uL (ref 0.00–0.07)
Basophils Absolute: 0 10*3/uL (ref 0.0–0.1)
Basophils Relative: 0 %
Eosinophils Absolute: 0.1 10*3/uL (ref 0.0–0.5)
Eosinophils Relative: 1 %
HCT: 43 % (ref 39.0–52.0)
Hemoglobin: 14.5 g/dL (ref 13.0–17.0)
Immature Granulocytes: 1 %
Lymphocytes Relative: 17 %
Lymphs Abs: 2 10*3/uL (ref 0.7–4.0)
MCH: 31.3 pg (ref 26.0–34.0)
MCHC: 33.7 g/dL (ref 30.0–36.0)
MCV: 92.7 fL (ref 80.0–100.0)
Monocytes Absolute: 0.8 10*3/uL (ref 0.1–1.0)
Monocytes Relative: 7 %
Neutro Abs: 9 10*3/uL — ABNORMAL HIGH (ref 1.7–7.7)
Neutrophils Relative %: 74 %
Platelets: 195 10*3/uL (ref 150–400)
RBC: 4.64 MIL/uL (ref 4.22–5.81)
RDW: 13.4 % (ref 11.5–15.5)
WBC: 11.9 10*3/uL — ABNORMAL HIGH (ref 4.0–10.5)
nRBC: 0 % (ref 0.0–0.2)

## 2019-04-02 LAB — COMPREHENSIVE METABOLIC PANEL
ALT: 14 U/L (ref 0–44)
AST: 20 U/L (ref 15–41)
Albumin: 4.3 g/dL (ref 3.5–5.0)
Alkaline Phosphatase: 59 U/L (ref 38–126)
Anion gap: 12 (ref 5–15)
BUN: 20 mg/dL (ref 8–23)
CO2: 21 mmol/L — ABNORMAL LOW (ref 22–32)
Calcium: 9.1 mg/dL (ref 8.9–10.3)
Chloride: 108 mmol/L (ref 98–111)
Creatinine, Ser: 1.3 mg/dL — ABNORMAL HIGH (ref 0.61–1.24)
GFR calc Af Amer: 60 mL/min — ABNORMAL LOW (ref >60)
GFR calc non Af Amer: 52 mL/min — ABNORMAL LOW (ref >60)
Glucose, Bld: 130 mg/dL — ABNORMAL HIGH (ref 70–99)
Potassium: 4.1 mmol/L (ref 3.5–5.1)
Sodium: 141 mmol/L (ref 135–145)
Total Bilirubin: 1.1 mg/dL (ref 0.3–1.2)
Total Protein: 7.9 g/dL (ref 6.5–8.1)

## 2019-04-02 LAB — GLUCOSE, CAPILLARY: Glucose-Capillary: 261 mg/dL — ABNORMAL HIGH (ref 70–99)

## 2019-04-02 LAB — PROTIME-INR
INR: 1 (ref 0.8–1.2)
Prothrombin Time: 13.1 seconds (ref 11.4–15.2)

## 2019-04-02 LAB — BRAIN NATRIURETIC PEPTIDE: B Natriuretic Peptide: 763 pg/mL — ABNORMAL HIGH (ref 0.0–100.0)

## 2019-04-02 LAB — APTT: aPTT: 30 seconds (ref 24–36)

## 2019-04-02 LAB — TROPONIN I (HIGH SENSITIVITY): Troponin I (High Sensitivity): 706 ng/L (ref <18)

## 2019-04-02 LAB — RESPIRATORY PANEL BY RT PCR (FLU A&B, COVID)
Influenza A by PCR: NEGATIVE
Influenza B by PCR: NEGATIVE
SARS Coronavirus 2 by RT PCR: NEGATIVE

## 2019-04-02 MED ORDER — NOREPINEPHRINE 4 MG/250ML-% IV SOLN
0.0000 ug/min | INTRAVENOUS | Status: DC
  Administered 2019-04-02: 50 ug/min via INTRAVENOUS

## 2019-04-02 MED ORDER — AMIODARONE HCL IN DEXTROSE 360-4.14 MG/200ML-% IV SOLN
INTRAVENOUS | Status: AC
  Filled 2019-04-02: qty 200

## 2019-04-02 MED ORDER — FUROSEMIDE 10 MG/ML IJ SOLN
40.0000 mg | Freq: Once | INTRAMUSCULAR | Status: AC
  Administered 2019-04-02: 40 mg via INTRAVENOUS
  Filled 2019-04-02: qty 4

## 2019-04-02 MED ORDER — EPINEPHRINE 1 MG/10ML IJ SOSY
PREFILLED_SYRINGE | INTRAMUSCULAR | Status: AC | PRN
  Administered 2019-04-02 (×2): 1 via INTRAVENOUS

## 2019-04-02 MED ORDER — ATROPINE SULFATE 1 MG/ML IJ SOLN
INTRAMUSCULAR | Status: AC | PRN
  Administered 2019-04-02: 1 mg via INTRAVENOUS

## 2019-04-02 MED ORDER — HEPARIN (PORCINE) 25000 UT/250ML-% IV SOLN: 900.0000 [IU]/h | INTRAVENOUS | Status: DC

## 2019-04-02 MED ORDER — EPINEPHRINE HCL 5 MG/250ML IV SOLN IN NS
0.5000 ug/min | INTRAVENOUS | Status: DC
  Administered 2019-04-02: 5 ug/min via INTRAVENOUS
  Filled 2019-04-02 (×2): qty 250

## 2019-04-02 MED ORDER — HEPARIN BOLUS VIA INFUSION
4000.0000 [IU] | Freq: Once | INTRAVENOUS | Status: DC
  Filled 2019-04-02: qty 4000

## 2019-04-02 MED ORDER — AMIODARONE HCL 150 MG/3ML IV SOLN
INTRAVENOUS | Status: AC | PRN
  Administered 2019-04-02: 300 mg via INTRAVENOUS

## 2019-04-02 MED ORDER — NITROGLYCERIN IN D5W 200-5 MCG/ML-% IV SOLN
0.0000 ug/min | INTRAVENOUS | Status: DC
  Administered 2019-04-02: 5 ug/min via INTRAVENOUS
  Filled 2019-04-02: qty 250

## 2019-04-02 MED ORDER — AMIODARONE IV BOLUS ONLY 150 MG/100ML
INTRAVENOUS | Status: AC
  Filled 2019-04-02: qty 100

## 2019-04-02 NOTE — ED Notes (Signed)
Per Dr Micah Flesher dicontinue all drips.

## 2019-04-02 NOTE — ED Notes (Addendum)
16L High flow initiated d/t pt satting at 88% on non rebreather.

## 2019-04-02 NOTE — ED Notes (Signed)
Continued Vtach on monitor

## 2019-04-02 NOTE — ED Notes (Signed)
Pt placed on NRB at 15L d/t O2 sats being at 88% on 6L [place] with good wave form. Patient now satting at 96%.

## 2019-04-02 NOTE — ED Notes (Signed)
CPR initiated with the lucas

## 2019-04-02 NOTE — ED Notes (Signed)
X-ray at bedside

## 2019-04-02 NOTE — Progress Notes (Signed)
Patient reportedly underwent recent cardiac catheterization, awaiting bypass surgery.  He developed congestive heart failure today, presented to [facility] ED, hemodynamically unstable, requiring CPR on 3 occasions over 90 minutes.  I was called to place intra-aortic balloon pump and transvenous temporary pacemaker.  On arrival in the patient's room, the patient is pulseless, on Levophed and epinephrine drip with slow junctional rhythm without significant clinical benefit with transcutaneous pacing.  Patient intubated, with oxygen saturation of less than 40%.  Patient declared expired at  22:15.

## 2019-04-02 NOTE — ED Notes (Signed)
Patient unable to sign transfer consent due to intubation and sedation.

## 2019-04-02 NOTE — Consult Note (Signed)
[place] for Heparin  Indication: chest pain/ACS  Allergies  Allergen Reactions  . Codeine Other (See Comments)    Deriviatives, dysfunctional ,woozy,spinning,dizzy    Patient Measurements: Height: 5\' 11"  (180.3 cm) Weight: 170 lb (77.1 kg) IBW/kg (Calculated) : 75.3 Heparin Dosing Weight: 77.1 kg   Vital Signs: Temp: 98.2 F (36.8 C) (02/28 1933) Temp Source: Oral (02/28 1933) BP: 110/83 (02/28 1939) Pulse Rate: 93 (02/28 1940)  Labs: No results for input(s): HGB, HCT, PLT, APTT, LABPROT, INR, HEPARINUNFRC, HEPRLOWMOCWT, CREATININE, CKTOTAL, CKMB, TROPONINIHS in the last 72 hours.  CrCl cannot be calculated (Patient's most recent lab result is older than the maximum 21 days allowed.).   Assessment: Pharmacy has been consulted for heparin dosing. No aPTT  Ordered. Per chart review, no AC prior to admission. Pharmacy was unable to contact the nurse to verify whether this patient was on an anticoagulant prior to admission and was told this nurse was in a code for Mr. Niemi.     Goal of Therapy:  Heparin level 0.3-0.7 units/ml Monitor platelets by anticoagulation protocol: Yes   Plan: Baseline labs have been ordered  Heparin DW: 77.1 kg  Give 4000 units bolus x 1 Start heparin infusion at 900 units/hr Check anti-Xa level in  hours and daily while on heparin, per protocol Continue to monitor H&H and platelets   Tesa Meadors R Menna Abeln 03/14/2019,8:09 PM

## 2019-04-02 NOTE — ED Notes (Signed)
Date and time results received: 04/01/2019  (use smartphrase ".now" to insert current time)  Test: troponin Critical Value: 706  Name of Provider Notified: Dr. Darl Householder  Orders Received? Or Actions Taken?:

## 2019-04-02 NOTE — ED Notes (Signed)
Pt being bagged by Dr Darl Householder at this time

## 2019-04-02 NOTE — ED Notes (Signed)
Pt in PEA, cpr resumed.

## 2019-04-02 NOTE — ED Notes (Signed)
[place] [facility] services notified- Gentry Fitz [phone]

## 2019-04-02 NOTE — ED Notes (Signed)
Patient time of death occurred at 05-12-15.

## 2019-04-02 NOTE — ED Notes (Addendum)
Pt successfully intubated by dr Darl Householder verified by co2 colour change and breath sounds. ET tube 22 at the lip.

## 2019-04-02 NOTE — ED Notes (Addendum)
Pt brading down on monitorr. HR 29. CPR initiated by [facility] EDT.

## 2019-04-02 NOTE — ED Notes (Signed)
Pt given 100 rocc by michelle rn

## 2019-04-02 NOTE — ED Notes (Signed)
Levophed initiated at 50mg /hr

## 2019-04-02 NOTE — ED Notes (Signed)
IO placed in right knee by [facility]

## 2019-04-02 NOTE — ED Notes (Signed)
1 Epi given by susan rn

## 2019-04-02 NOTE — ED Notes (Addendum)
Pulse check. Pt in vfib. Pt shocked.

## 2019-04-02 NOTE — ED Notes (Signed)
Pt given 20 etomidate by michelle rn

## 2019-04-02 NOTE — ED Triage Notes (Addendum)
Pt here via [facility] from home with c/o Chest pain.   Per EMS pt started having chest pain at around 9am this morning that is constant and central. Pt has cardiac history of multiple blockages and is scheduled to have open heart surgery on Wednesday d/t three blockages and need to replace heart valve. Pt took 2x nitro at home.   With Ems pt started to complaining of SHOB, pt was satting at 88% room air, and was up to 100% on 4L [place].  Pt received 324mg  aspirin with EMS.   Ems VS- bp 117/63, HR 80's. EMS EKG shows left bundle branch block.

## 2019-04-02 NOTE — ED Notes (Signed)
CPR resumed 

## 2019-04-02 NOTE — ED Notes (Signed)
Pt placed at 70 bpm. Capture with pulse.

## 2019-04-02 NOTE — ED Notes (Signed)
Dr Darl Householder intubating patient at this time

## 2019-04-02 NOTE — ED Notes (Signed)
Pulse- no pulse. Cpr resumed

## 2019-04-02 NOTE — ED Notes (Signed)
RT at bedside.

## 2019-04-02 NOTE — ED Notes (Signed)
Pt in complete heart block

## 2019-04-02 NOTE — ED Notes (Addendum)
Zoll pads placed on pt and code cart bought into room. Continous monitoring in place.

## 2019-04-02 NOTE — ED Notes (Signed)
Epi drip initiated at 5mg /hr.

## 2019-04-02 NOTE — ED Notes (Signed)
[facility] at bedside 

## 2019-04-02 NOTE — Progress Notes (Signed)
Patient intubated per Dr Darl Householder, CPR ACLS protocols followed.  Placed patient on vent AC 500vt  Rate 20 100% peep 5 per Dr Darl Householder.  Unable to obtain abg.due to cardiac failure

## 2019-04-02 NOTE — ED Provider Notes (Addendum)
[place] EMERGENCY DEPARTMENT Provider Note   CSN: [id number] Arrival date & time: 03/27/2019  1930     History Chief Complaint  Patient presents with  . Chest Pain    Daniel Hanna is a 78 y.o. male hx of CAD, previous MI here presenting with shortness of breath.  Patient states that he woke up this morning and has acute onset of shortness of breath that progressively got worse.  Denies any chest pain.  He states that it is hard for him to catch his breath today .  He denies any leg swelling. Of note patient has a complicated cardiac history.  He was cath earlier this found by Dr. Burt Knack and has multivessel disease.  He also has a transvenous aortic valve replacement and has baseline aortic stenosis.  He also has a echo recently that showed a EF of 40%.  Patient is scheduled for CABG in 3 days.   The history is provided by the patient.  Level V caveat- condition of patient      Past Medical History:  Diagnosis Date  . Arthritis    fingers  . Coronary artery disease CARDIOLOGIST--   DR BERRY--  LOV NOTE 01-29-2012  W/ CHART  . ED (erectile dysfunction)   . History of coronary artery stent placement    STENTS X2  TO LAD AND CIRCUMFLEX  . History of elevated PSA 2003   PSA=0.95  . History of myocardial infarction DEC 2004   S/P STENT X2  AT [facility]  . Hypercholesterolemia   . Hypertension   . LBBB (left bundle branch block)   . Prostate cancer ([place]) 02/24/12   BX=Adenocarcinoma,Gleason 3+3=6,3+4=7 PSA=5.80    Patient Active Problem List   Diagnosis Date Noted  . Primary osteoarthritis of right knee 10/21/2017  . Dilated cardiomyopathy ([place]) 07/15/2017  . Severe aortic stenosis 07/13/2016  . Benign essential hypertension 01/09/2016  . Hyperlipidemia, mixed 07/10/2015  . Carotid artery disease ([place]) 01/16/2015  . 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 CAD (coronary artery disease) 01/11/2013  . Essential hypertension 01/11/2013  . Hyperlipidemia  01/11/2013  . Left bundle branch block 01/11/2013  . History of elevated PSA   . Prostate cancer ([place]) 02/24/2012    Past Surgical History:  Procedure Laterality Date  . CARDIAC CATHETERIZATION  03-02-2006  DR BERRY   PATENT STENTS/ 30-40% TAPERED DISTAL LEFT MAIN AND 50-60% AV GROOVE CIRCUMFLEX PRIOR TO STENT  &  50% LESION IN MID PORTION OF LAD.  Marland Kitchen CARDIOVASCULAR STRESS TEST  05-30-2010   DR BERRY   LOW RISK SCAN/  MODERATE SIZED INEROLATERAL SCAR WITHOUT ISCHEMIA / ISCHEMIA/ NO SIGNIFICANT CHANGE FROM PREVIOUS STUDY/   . COLONOSCOPY WITH PROPOFOL N/A 09/23/2015   Procedure: COLONOSCOPY WITH PROPOFOL;  Surgeon: Manya Silvas, MD;  Location[facility] ENDOSCOPY;  Service: Endoscopy;  Laterality: N/A;  . CORONARY ANGIOPLASTY WITH STENT PLACEMENT  DEC 2004  AT [facility]   STENTING OF CIRCUMFLEX AND LAD  . ESOPHAGOGASTRODUODENOSCOPY (EGD) WITH PROPOFOL N/A 09/23/2015   Procedure: ESOPHAGOGASTRODUODENOSCOPY (EGD) WITH PROPOFOL;  Surgeon: Manya Silvas, MD;  Location: [facility] ENDOSCOPY;  Service: Endoscopy;  Laterality: N/A;  . HEMORRHOID SURGERY    . INGUINAL HERNIA REPAIR  1980'S  . PROSTATE BIOPSY  02/29/12   ADENOCARCINOMA  . RADIOACTIVE SEED IMPLANT N/A 05/12/2012   Procedure: RADIOACTIVE SEED IMPLANT;  Surgeon: Franchot Gallo, MD;  Location: [facility];  Service: Urology;  Laterality: N/A;  DR PORTABLE/RAD TECH NEEDED  . RIGHT/LEFT  HEART CATH AND CORONARY ANGIOGRAPHY N/A 03/07/2019   Procedure: RIGHT/LEFT HEART CATH AND CORONARY ANGIOGRAPHY;  Surgeon: Sherren Mocha, MD;  Location: [facility];  Service: Cardiovascular;  Laterality: N/A;  . TRANSTHORACIC ECHOCARDIOGRAM  05-30-2010   MILD CONCENTRIC LVH/ EF [id number] STAGE I DIASTOLIC DYSFUCTION/ MILD MR, TR, & AR/ MILD SCLEROTIC AORTIC VALVE WITHOUT STENOSIS       Family History  Problem Relation Age of Onset  . Diabetes Mother   . Stroke Mother   . Cancer Sister 46       unknown    Social History   Tobacco Use    . Smoking status: Former Smoker    Packs/day: 1.00    Years: 25.00    Pack years: 25.00    Quit date: 05/06/2001    Years since quitting: 17.9  . Smokeless tobacco: Never Used  Substance Use Topics  . Alcohol use: No  . Drug use: No    Home Medications Prior to Admission medications   Medication Sig Start Date End Date Taking? Authorizing Provider  amLODipine (NORVASC) 2.5 MG tablet Take 2.5 mg by mouth daily.  03/21/18   [provider]  aspirin 81 MG tablet Take 81 mg by mouth daily.    [provider]  carvedilol (COREG) 3.125 MG tablet Take 3.125 mg by mouth daily.     [provider]  clopidogrel (PLAVIX) 75 MG tablet Take 75 mg by mouth daily.    [provider]  hydrOXYzine (ATARAX/VISTARIL) 25 MG tablet Take 25 mg by mouth daily as needed for itching.    [provider]  lisinopril (ZESTRIL) 10 MG tablet Take 10 mg by mouth daily.    [provider]  nitroGLYCERIN (NITROSTAT) 0.4 MG SL tablet Place 1 tablet (0.4 mg total) under the tongue every 5 (five) minutes as needed for chest pain. 03/30/19 06/28/19  Rexene Alberts, MD  pravastatin (PRAVACHOL) 20 MG tablet Take 20 mg by mouth daily.  06/26/15   [provider]    Allergies    Codeine  Review of Systems   Review of Systems  Cardiovascular: Positive for chest pain.  All other systems reviewed and are negative.   Physical Exam Updated Vital Signs BP 132/76   Pulse (!) 101   Temp 98.2 F (36.8 C) (Oral)   Resp (!) 21   Ht 5\' 11"  (1.803 m)   Wt 77.1 kg   SpO2 100%   BMI 23.71 kg/m   Physical Exam Vitals and nursing note reviewed.  Constitutional:      Comments: Tachypneic, moderate distress   HENT:     Head: Normocephalic.  Eyes:     Pupils: Pupils are equal, round, and reactive to light.  Cardiovascular:     Heart sounds: Murmur present. Crescendo  systolic murmur present.     Comments: Tachycardic, + systolic murmur  Pulmonary:      Comments: Crackles bilateral bases, talking in 2-3 word sentences  Musculoskeletal:        General: Normal range of motion.     Cervical back: Normal range of motion and neck supple.     Comments: 1+ edema   Skin:    General: Skin is warm.     Capillary Refill: Capillary refill takes less than 2 seconds.  Neurological:     General: No focal deficit present.     Mental Status: He is oriented to person, place, and time.  Psychiatric:  Mood and Affect: Mood normal.        Behavior: Behavior normal.     ED Results / Procedures / Treatments   Labs (all labs ordered are listed, but only abnormal results are displayed) Labs Reviewed  CBC WITH DIFFERENTIAL/PLATELET - Abnormal; Notable for the following components:      Result Value   WBC 11.9 (*)    Neutro Abs 9.0 (*)    All other components within normal limits  BRAIN NATRIURETIC PEPTIDE - Abnormal; Notable for the following components:   B Natriuretic Peptide 763.0 (*)    All other components within normal limits  RESPIRATORY PANEL BY RT PCR (FLU A&B, COVID)  PROTIME-INR  APTT  COMPREHENSIVE METABOLIC PANEL  TROPONIN I (HIGH SENSITIVITY)    EKG EKG Interpretation  Date/Time:  Sunday April 02 2019 19:35:54 EST Ventricular Rate:  88 PR Interval:    QRS Duration: 189 QT Interval:  416 QTC Calculation: 504 R Axis:   32 Text Interpretation: Sinus rhythm Probable left atrial enlargement IVCD, consider atypical LBBB unchanged since earlier in the day Confirmed by Wandra Arthurs [id number]) on 03/21/2019 7:59:57 PM   Radiology DG Chest Port 1 View  Result Date: 03/26/2019 CLINICAL DATA:  Shortness of breath. EXAM: PORTABLE CHEST 1 VIEW COMPARISON:  CTA of the chest of 03/15/2019 chest radiograph report 01/13/2018. FINDINGS: Midline trachea. Mild cardiomegaly. Atherosclerosis in the transverse aorta. No pleural effusion or pneumothorax. Moderate pulmonary interstitial prominence and indistinctness. Somewhat more confluent  opacity at the left lung base, likely due to atelectasis adjacent to hiatal hernia. IMPRESSION: Cardiomegaly with development of pulmonary tissue all thickening, likely representing congestive heart failure. Hiatal hernia with adjacent left lower lobe atelectasis. Aortic Atherosclerosis (ICD10-I70.0). Electronically Signed   By: Abigail Miyamoto M.D.   On: 03/12/2019 19:55    Procedures Procedures (including critical care time)  CRITICAL CARE Performed by: Wandra Arthurs   Total critical care time: 90 minutes  Critical care time was exclusive of separately billable procedures and treating other patients.  Critical care was necessary to treat or prevent imminent or life-threatening deterioration.  Critical care was time spent personally by me on the following activities: development of treatment plan with patient and/or surrogate as well as nursing, discussions with consultants, evaluation of patient's response to treatment, examination of patient, obtaining history from patient or surrogate, ordering and performing treatments and interventions, ordering and review of laboratory studies, ordering and review of radiographic studies, pulse oximetry and re-evaluation of patient's condition.  INTUBATION Performed by: Wandra Arthurs  Required items: required blood products, implants, devices, and special equipment available Patient identity confirmed: provided demographic data and hospital-assigned identification number Time out: Immediately prior to procedure a "time out" was called to verify the correct patient, procedure, equipment, support staff and site/side marked as required.  Indications: hypoxia  Intubation method: Glidescope Laryngoscopy   Preoxygenation: BVM  Sedatives: 20 mg Etomidate Paralytic: 100 mg rocuronium  Tube Size: 7.5 cuffed  Post-procedure assessment: chest rise and ETCO2 monitor Breath sounds: equal and absent over the epigastrium Tube secured with: ETT holder Chest  x-ray interpreted by radiologist and me.  Chest x-ray findings: endotracheal tube in appropriate position  Patient tolerated the procedure well with no immediate complications.    Medications Ordered in ED Medications  nitroGLYCERIN 50 mg in dextrose 5 % 250 mL (0.2 mg/mL) infusion (0 mcg/min Intravenous Stopped 04/01/2019 2025)  heparin bolus via infusion 4,000 Units (has no administration in time range)  heparin ADULT  infusion 100 units/mL (25000 units/212mL sodium chloride 0.45%) (has no administration in time range)  amiodarone (NEXTERONE) 150-4.21 MG/100ML-% bolus (has no administration in time range)  amiodarone (NEXTERONE PREMIX) 360-4.14 MG/200ML-% (1.8 mg/mL) IV infusion (has no administration in time range)  EPINEPHrine (ADRENALIN) 1 MG/10ML injection (1 Syringe Intravenous Given 03/08/2019 2053)  furosemide (LASIX) injection 40 mg (40 mg Intravenous Given 03/10/2019 1956)  EPINEPHrine (ADRENALIN) 1 MG/10ML injection (1 Syringe Intravenous Given 03/21/2019 2038)  amiodarone (CORDARONE) injection (300 mg Intravenous Given 03/24/2019 2045)    ED Course  I have reviewed the triage vital signs and the nursing notes.  Pertinent labs & imaging results that were available during my care of the patient were reviewed by me and considered in my medical decision making (see chart for details).    MDM Rules/Calculators/A&P                      Daniel Hanna is a 79 y.o. male here presenting with shortness of breath.  Patient has significant history of CAD with multivessel disease on recent cath.  He also has CHF and as well as aortic stenosis.  I am concerned for pulmonary edema versus NSTEMI  7:50 PM He has baseline left bundle branch block.  However his EKG now shows more ischemic changes.  I talked to Dr. Rockey Situ from cardiology. He states that it is not a STEMI and that patient needs to be transferred to [facility]. He also recommend nitro drip as well as Lasix for diuresis.  And if he gets worse patient  will need emergent balloon pump.  8:30 PM Patient acutely decompensated and is on a nonrebreather and oxygen level is down to 80.  He now became unresponsive.  Has a very thready pulse and I intubated the patient.  8:40 pm Shortly after intubation, patient coded.  Received 2 rounds of epinephrine.  Initially was bradycardia and during the code, patient went into V. tach.  Patient was shocked once and given 300 mg of amiodarone.  I also talked to Dr. Neena Rhymes, cardiology on call at [facility].  He agreed that patient needs to be transferred emergently to [facility].  He will see patient on arrival.  I talked to Dr. Vanita Panda at [facility] who will be the accepting doctor.  9:20 pm [facility] is now at bedside to pick up patient.  Patient did have several episodes of bradycardia.  Patient is also hypotensive and patient was started on Levophed drip as well as epinephrine drip.  Patient remains in critical condition.  Will transfer emergently to [facility] ED and may need balloon pump.  9:40 pm While patient was on the EMS stretcher, patient coded again.  He went into complete heart block.  And was paced externally.  At this point, patient is not stable for transfer.  I talked to Dr. Saralyn Pilar from cardiology.  He recommended that I activated code STEMI he will call in his team.  He will try and perform a balloon pump and pace patient.  He still requests transfer to [facility].  10:27 PM Patient coded another time.  And Dr. Saralyn Pilar is at bedside.  ACLS initiated again but unable to gain ROSC.  He recommend that we discontinue after that this point. I talked to the wife on the phone.  She will come in to see his body. Time of death 10:17 pm.   Final Clinical Impression(s) / ED Diagnoses Final diagnoses:  None    Rx / DC Orders ED  Discharge Orders    None       Drenda Freeze, MD 03/19/2019 2125    Drenda Freeze, MD 03/07/2019 2233

## 2019-04-02 NOTE — ED Notes (Addendum)
DNR occurred at 2217.

## 2019-04-02 NOTE — ED Notes (Addendum)
No pulse with pulse check. Ena Dawley rn continuing cpr.

## 2019-04-02 NOTE — ED Notes (Addendum)
CPR initiated by lucas machine d/t pt bradying down to 29.

## 2019-04-02 NOTE — ED Notes (Signed)
Lucas placed on pt

## 2019-04-03 ENCOUNTER — Ambulatory Visit: Payer: PPO | Admitting: Thoracic Surgery (Cardiothoracic Vascular Surgery)

## 2019-04-03 ENCOUNTER — Other Ambulatory Visit (HOSPITAL_COMMUNITY): Payer: PPO

## 2019-04-03 ENCOUNTER — Ambulatory Visit (HOSPITAL_COMMUNITY): Payer: PPO

## 2019-04-03 ENCOUNTER — Inpatient Hospital Stay (HOSPITAL_COMMUNITY): Admission: RE | Admit: 2019-04-03 | Payer: PPO | Source: Ambulatory Visit

## 2019-04-03 MED FILL — Medication: Qty: 1 | Status: AC

## 2019-04-03 NOTE — Progress Notes (Signed)
[place] [place] [address] [place]) [place] [address] Phone: [phone] Fax: [place] [id number], [place] [address] Phone: [phone] Fax: [phone]  [place], [address] [place] [address] Phone: [phone] Fax: [phone]      Your procedure is scheduled on Wednesday, March 3rd.  Report to [facility] Main Entrance "A" at 6:30 A.M., and check in at the Admitting office.  Call this number if you have problems the morning of surgery:  [phone]  Call [phone] if you have any questions prior to your surgery date Monday-Friday 8am-4pm    Remember:  Do not eat or drink after midnight the night before your surgery      Take these medicines the morning of surgery with A SIP OF WATER   Amlodipine (Norvasc)  Carvedilol (Coreg)  Hydroxizine - if needed  Nitroglycerin - if needed  Pravastatin (pravachol)  Follow your surgeon's instructions on when to stop Aspirin & Plavix.  If no instructions were given by your surgeon then you will need to call the office to get those instructions.    7 days prior to surgery STOP taking any Aleve, Naproxen, Ibuprofen, Motrin, Advil, Goody's, BC's, all herbal medications, fish oil, and all vitamins.    The Morning of Surgery  Do not wear jewelry.  Do not wear lotions, powders, colognes, or deodorant  Men may shave face and neck.  Do not bring valuables to the hospital.  [facility] is not responsible for any belongings or valuables.  If you are a smoker, DO NOT Smoke 24 hours prior to surgery  If you wear a CPAP at night please bring your mask the morning of surgery   Remember that you must have someone to transport you home after your surgery, and remain with you for 24 hours if you are discharged the  same day.   Please bring cases for contacts, glasses, hearing aids, dentures or bridgework because it cannot be worn into surgery.    Leave your suitcase in the car.  After surgery it may be brought to your room.  For patients admitted to the hospital, discharge time will be determined by your treatment team.  Patients discharged the day of surgery will not be allowed to drive home.    Special instructions:   [facility]- Preparing For Surgery  Before surgery, you can play an important role. Because skin is not sterile, your skin needs to be as free of germs as possible. You can reduce the number of germs on your skin by washing with CHG (chlorahexidine gluconate) Soap before surgery.  CHG is an antiseptic cleaner which kills germs and bonds with the skin to continue killing germs even after washing.    Oral Hygiene is also important to reduce your risk of infection.  Remember - BRUSH YOUR TEETH THE MORNING OF SURGERY WITH YOUR REGULAR TOOTHPASTE  Please do not use if you have an allergy to CHG or antibacterial soaps. If your skin becomes reddened/irritated stop using the CHG.  Do not shave (including legs and underarms) for at least 48 hours prior to first CHG shower. It is OK to shave your face.  Please follow these instructions carefully.   1. Shower the NIGHT BEFORE SURGERY and the [facility]  OF SURGERY with CHG Soap.   2. If you chose to wash your hair, wash your hair first as usual with your normal shampoo.  3. After you shampoo, rinse your hair and body thoroughly to remove the shampoo.  4. Use CHG as you would any other liquid soap. You can apply CHG directly to the skin and wash gently with a scrungie or a clean washcloth.   5. Apply the CHG Soap to your body ONLY FROM THE NECK DOWN.  Do not use on open wounds or open sores. Avoid contact with your eyes, ears, mouth and genitals (private parts). Wash Face and genitals (private parts)  with your normal soap.   6. Wash  thoroughly, paying special attention to the area where your surgery will be performed.  7. Thoroughly rinse your body with warm water from the neck down.  8. DO NOT shower/wash with your normal soap after using and rinsing off the CHG Soap.  9. Pat yourself dry with a CLEAN TOWEL.  10. Wear CLEAN PAJAMAS to bed the night before surgery, wear comfortable clothes the morning of surgery  11. Place CLEAN SHEETS on your bed the night of your first shower and DO NOT SLEEP WITH PETS.    Day of Surgery:  Please shower the morning of surgery with the CHG soap Do not apply any deodorants/lotions. Please wear clean clothes to the hospital/surgery center.   Remember to brush your teeth WITH YOUR REGULAR TOOTHPASTE.   Please read over the following fact sheets that you were given.

## 2019-04-03 DEATH — deceased

## 2019-04-05 ENCOUNTER — Inpatient Hospital Stay (HOSPITAL_COMMUNITY)
Admission: RE | Admit: 2019-04-05 | Payer: PPO | Source: Ambulatory Visit | Admitting: Thoracic Surgery (Cardiothoracic Vascular Surgery)

## 2019-04-05 SURGERY — REPLACEMENT, AORTIC VALVE, OPEN
Anesthesia: General | Site: Chest

## 2019-05-04 NOTE — Progress Notes (Signed)
   03/19/2019 1155  Clinical Encounter Type  Visited With Family  Visit Type Follow-up;Death  Referral From Nurse  Consult/Referral To Chaplain  Chaplain attended to family after receiving page. When Chaplain arrived daughter and her husband, grand daugther and several other family members were at bedside appropriately grieving and Chaplain introduced herself. Then Chaplain went to the waiting room were wife and several other family members were waiting and introduced herself to them. Chaplain escorted everyone to the bedside of the deceased. Patient's wife said patient's daughter was on her way from [place]. After Chaplain offered prayer several family members left, leaving patient's wife, daughter and her family. The patient's wife said "we had a good day today. Our granddaughter came and went to church with [place]." Patient didn't think he would make it but he did. After church the family went to [facility]  for lunch and patient's wife said he enjoyed himself. He was real happy when his daughter gave him some key lime pie. Although patient was scheduled for heart surgery in a few days, because of the time they spent out with him today, his sudden death is a surprise. Granddaughter said that patient said he was going to live to be 65 after his surgery. When daughter from [place] arrived, Bonney Roussel went out to meet her and her husband. Chaplain gave nurse the name of funeral home and made sure family signed the needed paperwork. Chaplain offered pastoral presence, empathy, and prayer.

## 2019-05-04 DEATH — deceased

## 2019-09-18 ENCOUNTER — Encounter (INDEPENDENT_AMBULATORY_CARE_PROVIDER_SITE_OTHER): Payer: PPO

## 2019-09-18 ENCOUNTER — Ambulatory Visit (INDEPENDENT_AMBULATORY_CARE_PROVIDER_SITE_OTHER): Payer: PPO | Admitting: Vascular Surgery

## 2019-12-02 IMAGING — CT CT CERVICAL SPINE W/O CM
4 of 7 series · 14 of 33 positions shown, 16 images · non-contrast
Comparison: CT scan of November 12, 2013.

CLINICAL DATA: Positive loss of consciousness and head injury after
fall off ladder.

EXAM:
CT HEAD WITHOUT CONTRAST
CT CERVICAL SPINE WITHOUT CONTRAST
TECHNIQUE: Multidetector CT imaging of the head and cervical spine was
performed following the standard protocol without intravenous
contrast. Multiplanar CT image reconstructions of the cervical spine
were also generated.

[Series 4: coronal soft tissue · coronal · 0.32mm/px · 3 of 68 slices shown]
[im 17/68  bone]
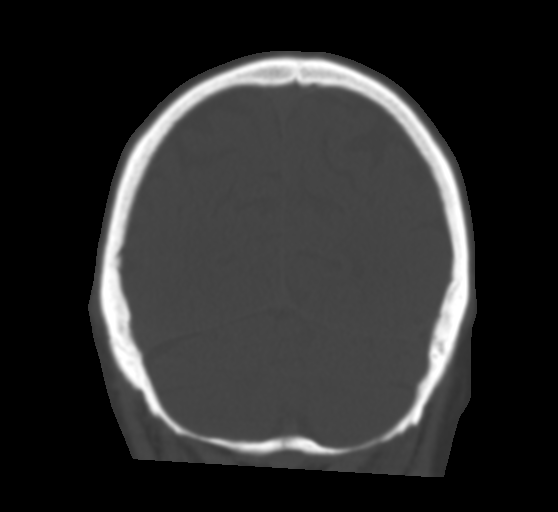
[im 34/68  bone]
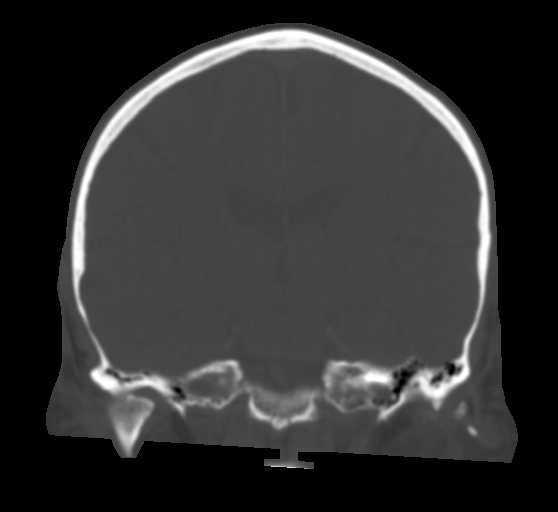
[im 51/68  bone]
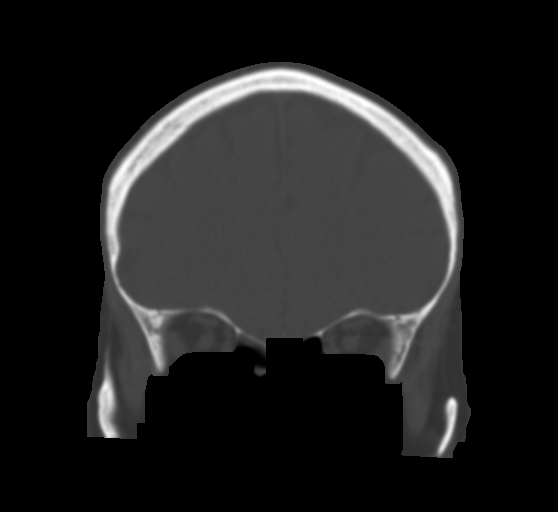

[Series 7: c spine soft · axial · 0.36mm/px · z∈[-255,-207]mm · 2 of 96 slices shown]
[im 24/96  soft-tissue]
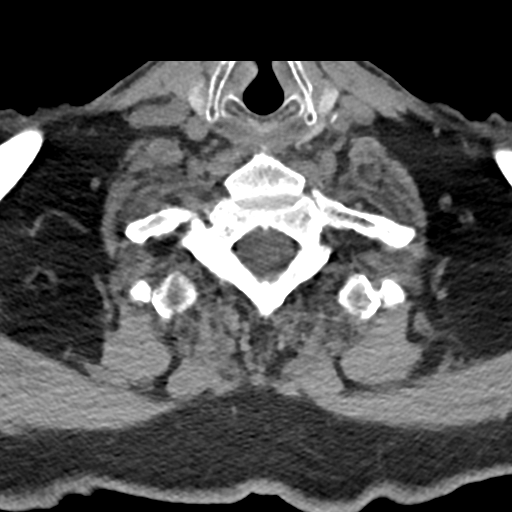
[im 48/96  soft-tissue]
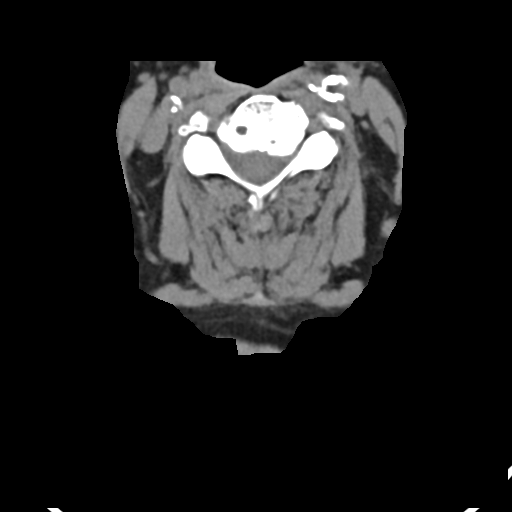

[Series 10: sagittal bone · sagittal · 0.28mm/px · 4 of 50 slices shown]
[im 10/50  bone]
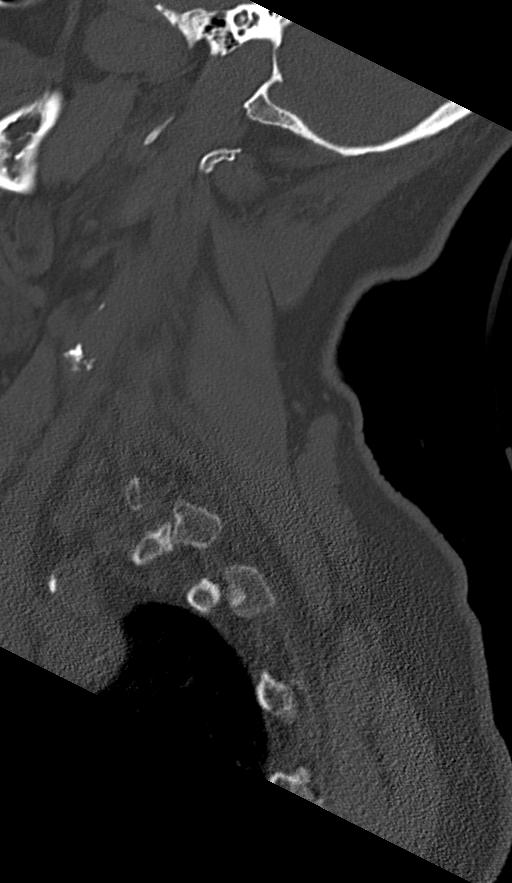
[im 20/50  bone]
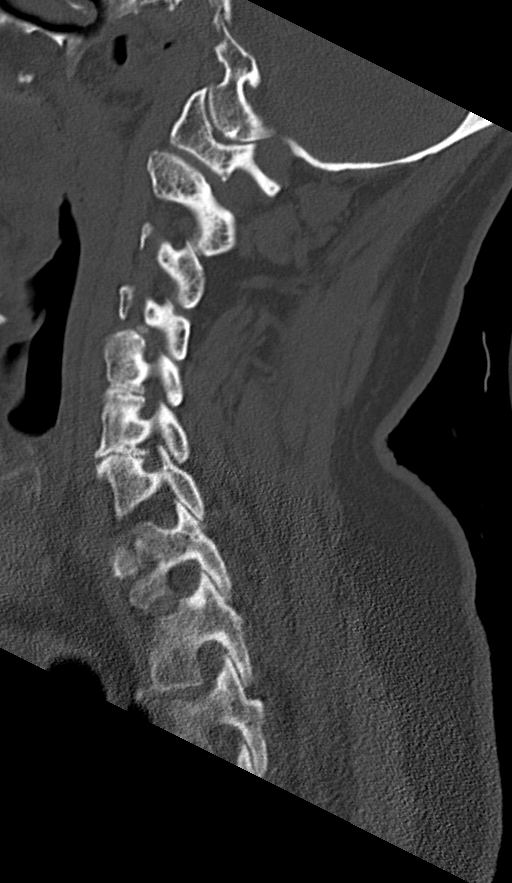
[im 30/50  bone]
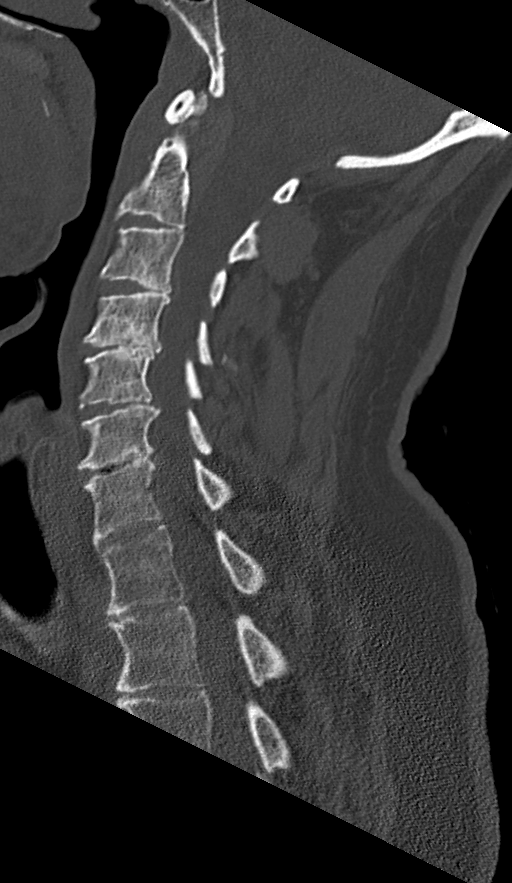
[im 40/50  bone]
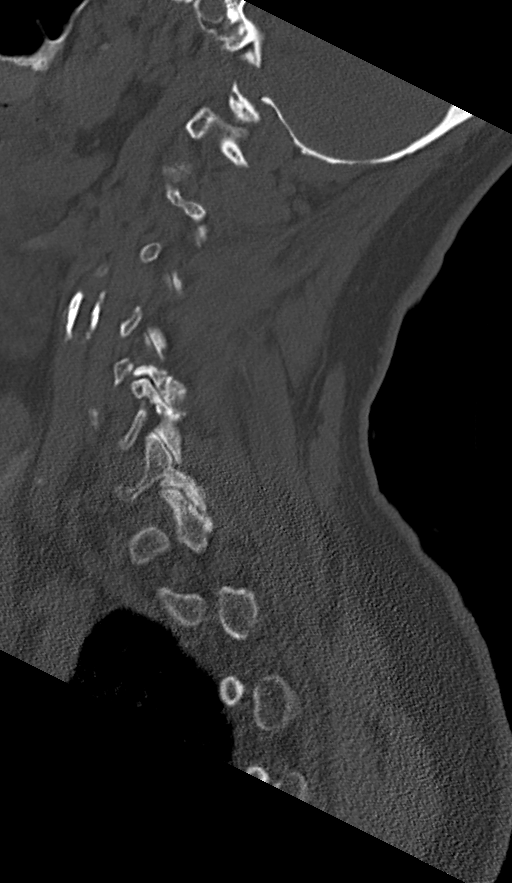

[Series 12: orthogonal bone · axial · 0.25mm/px · z∈[-323,-182]mm · 5 of 119 slices shown, 7 images]
[im 20/119  soft-tissue]
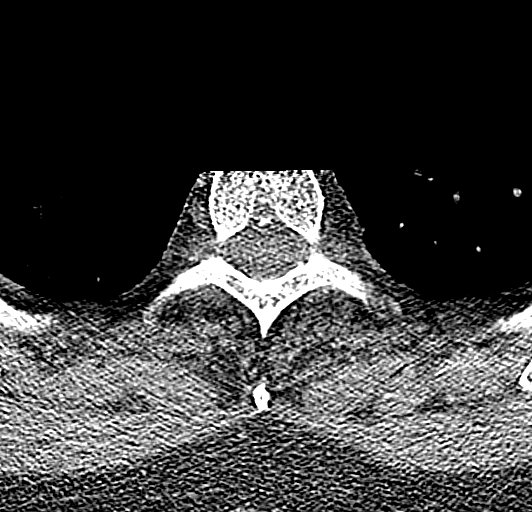
[im 20/119  bone]
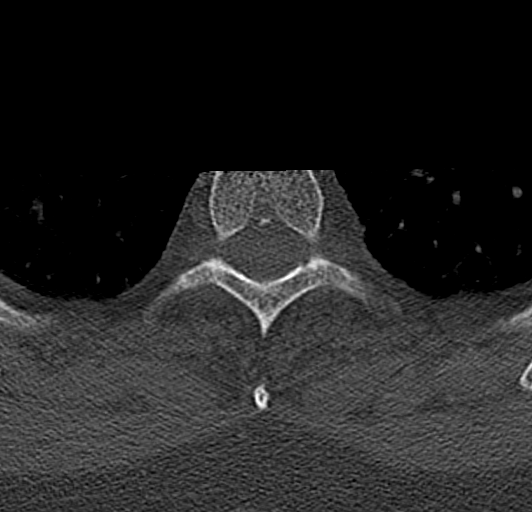
[im 40/119  bone]
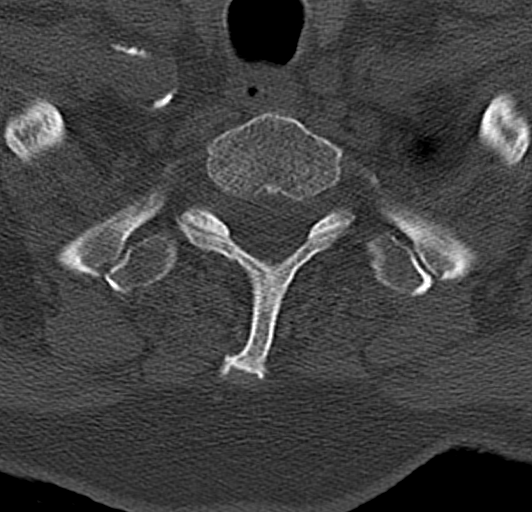
[im 60/119  bone]
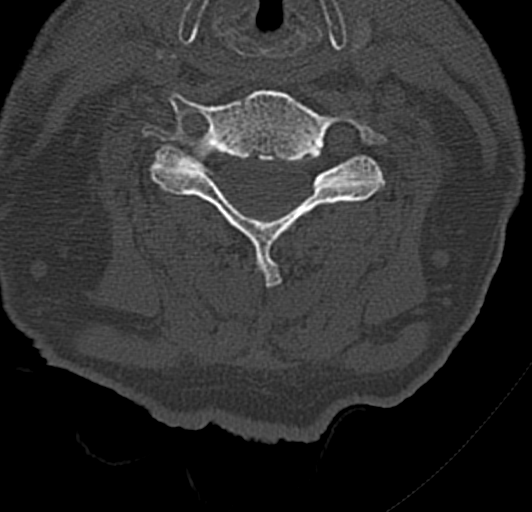
[im 79/119  bone]
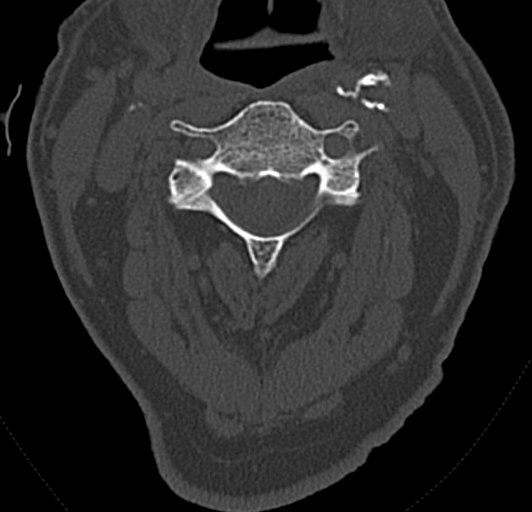
[im 99/119  soft-tissue]
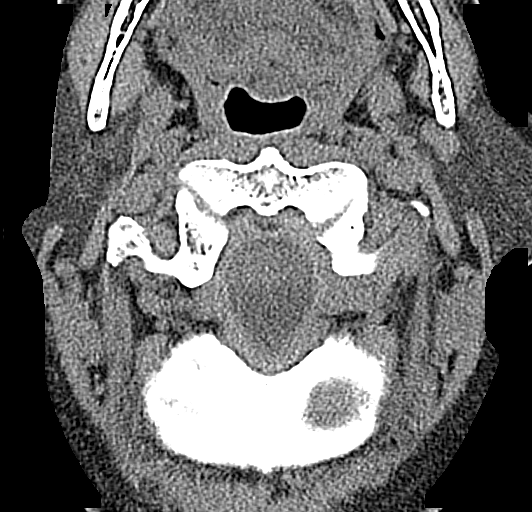
[im 99/119  bone]
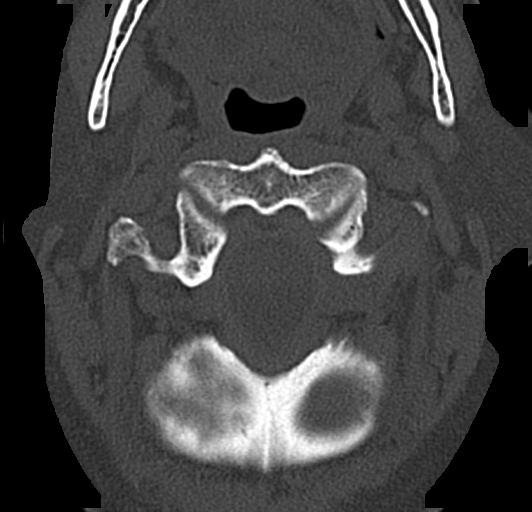

[14 of 33 positions shown; findings below may reference images not displayed]

FINDINGS: CT HEAD FINDINGS

Brain: No evidence of acute infarction, hemorrhage, hydrocephalus,
extra-axial collection or mass lesion/mass effect.

Vascular: No hyperdense vessel or unexpected calcification.

Skull: Normal. Negative for fracture or focal lesion.

Sinuses/Orbits: No acute finding.

Other: None.

CT CERVICAL SPINE FINDINGS

Alignment: Normal.

Skull base and vertebrae: No acute fracture. No primary bone lesion
or focal pathologic process.

Soft tissues and spinal canal: No prevertebral fluid or swelling. No
visible canal hematoma.

Disc levels: Moderate degenerative disc disease is noted at C4-5 and
C6-7 with anterior osteophyte formation.

Upper chest: Negative.

Other: Degenerative changes are seen involving the left-sided
posterior facet joints.
IMPRESSION: Normal head CT.

Multilevel degenerative disc disease. No acute abnormality seen in
the cervical spine.
